# Patient Record
Sex: Female | Born: 1980 | Race: White | Hispanic: No | Marital: Married | Smoking: Never smoker
Health system: Southern US, Community
[De-identification: ages and names within clinical notes are randomized; demographics above are authoritative.]

## PROBLEM LIST (undated history)

## (undated) DIAGNOSIS — Z789 Other specified health status: Secondary | ICD-10-CM

## (undated) HISTORY — DX: Other specified health status: Z78.9

## (undated) HISTORY — PX: NO PAST SURGERIES: SHX2092

---

## 2018-03-03 NOTE — L&D Delivery Note (Addendum)
Patient: Alyssa Hodge MRN: [id number]  GBS status: Negative  Patient is a 37 y.o. now G1P1 s/p NSVD at [redacted]w[redacted]d, who was admitted for SOL. AROM 11h 53m prior to delivery with clear fluid.    Delivery Note At 1:40 PM a viable female was delivered via Vaginal, Spontaneous (Presentation:Vertx; LOA.  APGAR: 7, 9; weight pending.   Placenta status: Spontaneous , intact.  Cord: Three-vessel cord with the following complications: Bilateral labial tears  Anesthesia: Epidural Episiotomy: None Lacerations: Labial Suture Repair: 3.0 vicryl Est. Blood Loss (mL):  100 mL  Mom to postpartum.  Baby to Couplet care / Skin to Skin.  Gifford Shave 12/22/2018, 2:12 PM   Head delivered LOA. No nuchal cord present. Shoulder and body delivered in usual fashion. Infant with spontaneous cry, placed on mother's abdomen, dried and bulb suctioned. Cord clamped x 2 after 1-minute delay, and cut by delivering mother. Cord blood drawn. Placenta delivered spontaneously with gentle cord traction. Fundus firm with massage and Pitocin. Perineum inspected and found to have bilateral labial lacerations which were repaired with 3.0 Vicryl sutures with good hemostasis achieved.  Midwife attestation: I was gloved and present for delivery in its entirety and I agree with the above resident's note.  Lajean Manes, CNM 2:33 PM

## 2018-04-28 ENCOUNTER — Emergency Department (HOSPITAL_BASED_OUTPATIENT_CLINIC_OR_DEPARTMENT_OTHER)
Admission: EM | Admit: 2018-04-28 | Discharge: 2018-04-29 | Disposition: A | Discharge status: Home / Self Care | Payer: PRIVATE HEALTH INSURANCE | Attending: Emergency Medicine | Admitting: Emergency Medicine

## 2018-04-28 ENCOUNTER — Other Ambulatory Visit: Payer: Self-pay

## 2018-04-28 ENCOUNTER — Encounter (HOSPITAL_BASED_OUTPATIENT_CLINIC_OR_DEPARTMENT_OTHER): Payer: Self-pay | Admitting: Emergency Medicine

## 2018-04-28 ENCOUNTER — Emergency Department (HOSPITAL_BASED_OUTPATIENT_CLINIC_OR_DEPARTMENT_OTHER): Payer: PRIVATE HEALTH INSURANCE

## 2018-04-28 DIAGNOSIS — R1013 Epigastric pain: Secondary | ICD-10-CM | POA: Diagnosis not present

## 2018-04-28 DIAGNOSIS — O210 Mild hyperemesis gravidarum: Secondary | ICD-10-CM | POA: Diagnosis not present

## 2018-04-28 DIAGNOSIS — O26891 Other specified pregnancy related conditions, first trimester: Secondary | ICD-10-CM | POA: Insufficient documentation

## 2018-04-28 DIAGNOSIS — Z3A01 Less than 8 weeks gestation of pregnancy: Secondary | ICD-10-CM | POA: Insufficient documentation

## 2018-04-28 DIAGNOSIS — E860 Dehydration: Secondary | ICD-10-CM | POA: Insufficient documentation

## 2018-04-28 DIAGNOSIS — O219 Vomiting of pregnancy, unspecified: Secondary | ICD-10-CM | POA: Diagnosis present

## 2018-04-28 LAB — PREGNANCY, URINE: Preg Test, Ur: POSITIVE — AB

## 2018-04-28 NOTE — ED Triage Notes (Signed)
Pt c/o persistent vomiting since this am getting worse with ABD pain, pt is [redacted] weeks pregnant.

## 2018-04-29 LAB — URINALYSIS, ROUTINE W REFLEX MICROSCOPIC
Bilirubin Urine: NEGATIVE
Glucose, UA: NEGATIVE mg/dL
HGB URINE DIPSTICK: NEGATIVE
Ketones, ur: >80 mg/dL — AB
Nitrite: NEGATIVE
Protein, ur: NEGATIVE mg/dL
Specific Gravity, Urine: 1.025 (ref 1.005–1.030)
pH: 6 (ref 5.0–8.0)

## 2018-04-29 LAB — URINALYSIS, MICROSCOPIC (REFLEX)

## 2018-04-29 MED ORDER — SODIUM CHLORIDE 0.9 % IV BOLUS (SEPSIS)
1000.0000 mL | Freq: Once | INTRAVENOUS | Status: AC
  Administered 2018-04-29: 1000 mL via INTRAVENOUS

## 2018-04-29 MED ORDER — ACETAMINOPHEN 325 MG PO TABS
650.0000 mg | ORAL_TABLET | Freq: Once | ORAL | Status: AC
  Administered 2018-04-29: 650 mg via ORAL
  Filled 2018-04-29: qty 2

## 2018-04-29 MED ORDER — DOXYLAMINE-PYRIDOXINE 10-10 MG PO TBEC: 1.0000 | DELAYED_RELEASE_TABLET | Freq: Three times a day (TID) | ORAL | 0 refills | Status: DC | PRN

## 2018-04-29 MED ORDER — ONDANSETRON HCL 4 MG/2ML IJ SOLN
4.0000 mg | Freq: Once | INTRAMUSCULAR | Status: AC
  Administered 2018-04-29: 4 mg via INTRAVENOUS
  Filled 2018-04-29: qty 2

## 2018-04-29 NOTE — ED Provider Notes (Signed)
[facility] EMERGENCY DEPARTMENT Provider Note   CSN: [id number] Arrival date & time: 04/28/18  2330    History   Chief Complaint Chief Complaint  Patient presents with  . Emesis  . Headache    HPI Alyssa Hodge is a 38 y.o. female.     The history is provided by the patient and the spouse.  Emesis  Severity:  Moderate Duration:  1 day Timing:  Constant Progression:  Worsening Chronicity:  New Relieved by:  Nothing Exacerbated by: palpation. Associated symptoms: abdominal pain and headaches   Associated symptoms: no diarrhea and no fever   Risk factors: pregnant   Headache  Associated symptoms: abdominal pain, nausea and vomiting   Associated symptoms: no diarrhea and no fever    Pt reports onset of epigastric abd pain a day ago with associated nausea/vomiting.  She reports she has had the nausea for at least a week Pt reports she is [redacted] weeks pregnant Of note, pt speaks english   PMH-none OB History    Gravida  1   Para      Term      Preterm      AB      Living        SAB      TAB      Ectopic      Multiple      Live Births               Home Medications    Prior to Admission medications   Medication Sig Start Date End Date Taking? Authorizing Provider  amoxicillin (AMOXIL) 875 MG tablet Take 875 mg by mouth 2 (two) times daily. 04/19/18   [provider]    Family History History reviewed. No pertinent family history.  Social History Social History   Tobacco Use  . Smoking status: Never Smoker  . Smokeless tobacco: Never Used  Substance Use Topics  . Alcohol use: Never    Frequency: Never  . Drug use: Never     Allergies   Patient has no known allergies.   Review of Systems Review of Systems  Constitutional: Negative for fever.  Gastrointestinal: Positive for abdominal pain, nausea and vomiting. Negative for diarrhea.  Genitourinary: Negative for vaginal bleeding and vaginal discharge.    Neurological: Positive for headaches.  All other systems reviewed and are negative.    Physical Exam Updated Vital Signs BP 132/68 (BP Location: Right Arm)   Pulse 88   Temp 98 F (36.7 C) (Oral)   Resp 18   Ht 1.753 m (5\' 9" )   Wt 63 kg   LMP 02/26/2018   SpO2 100%   BMI 20.51 kg/m   Physical Exam  CONSTITUTIONAL: Well developed/well nourished HEAD: Normocephalic/atraumatic EYES: EOMI/PERRL, no icterus ENMT: Mucous membranes moist NECK: supple no meningeal signs SPINE/BACK:entire spine nontender CV: S1/S2 noted, no murmurs/rubs/gallops noted LUNGS: Lungs are clear to auscultation bilaterally, no apparent distress ABDOMEN: soft, mild epigastric tenderness, no rebound or guarding, bowel sounds noted throughout abdomen GU:no cva tenderness NEURO: Pt is awake/alert/appropriate, moves all extremitiesx4.  No facial droop.   EXTREMITIES: pulses normal/equal, full ROM SKIN: warm, color normal PSYCH: no abnormalities of mood noted, alert and oriented to situation  ED Treatments / Results  Labs (all labs ordered are listed, but only abnormal results are displayed) Labs Reviewed  URINALYSIS, ROUTINE W REFLEX MICROSCOPIC - Abnormal; Notable for the following components:      Result Value   APPearance HAZY (*)  Ketones, ur >80 (*)    Leukocytes,Ua TRACE (*)    All other components within normal limits  PREGNANCY, URINE - Abnormal; Notable for the following components:   Preg Test, Ur POSITIVE (*)    All other components within normal limits  URINALYSIS, MICROSCOPIC (REFLEX) - Abnormal; Notable for the following components:   Bacteria, UA RARE (*)    All other components within normal limits    EKG None  Radiology US Ob Transvaginal  Result Date: 04/29/2018 CLINICAL DATA:  Abdominal and pelvic pain for 1 day. Estimated gestational age by LMP is 8 weeks 6 days. Quantitative beta HCG is not available. EXAM: OBSTETRIC <14 WK [place] AND TRANSVAGINAL OB US TECHNIQUE: Both  transabdominal and transvaginal ultrasound examinations were performed for complete evaluation of the gestation as well as the maternal uterus, adnexal regions, and pelvic cul-de-sac. Transvaginal technique was performed to assess early pregnancy. COMPARISON:  None. FINDINGS: Intrauterine gestational sac: A single intrauterine gestational sac is present. Yolk sac:  Yolk sac is present. Embryo:  Fetal pole is identified. Cardiac Activity: Fetal cardiac activity is observed. Heart Rate: 115 bpm CRL:  7.2 mm   6 w   4 d                  [place] EDC: 12/18/2018 Subchorionic hemorrhage:  None visualized. Maternal uterus/adnexae: Uterus is anteverted. No myometrial mass lesions identified. Both ovaries are visualized and appear normal. No abnormal adnexal masses. Corpus luteal cyst on the left. Small amount of free fluid in the pelvis adjacent to the left ovary. IMPRESSION: Single intrauterine pregnancy. Estimated gestational age by crown-rump length is 6 weeks 4 days. No acute complication is suggested sonographically. Electronically Signed   By: Burman Nieves M.D.   On: 04/29/2018 01:02    Procedures Procedures    Medications Ordered in ED Medications  sodium chloride 0.9 % bolus 1,000 mL (0 mLs Intravenous Stopped 04/29/18 0237)  ondansetron (ZOFRAN) injection 4 mg (4 mg Intravenous Given 04/29/18 0129)  acetaminophen (TYLENOL) tablet 650 mg (650 mg Oral Given 04/29/18 0235)     Initial Impression / Assessment and Plan / ED Course  I have reviewed the triage vital signs and the nursing notes.  Pertinent labs  results that were available during my care of the patient were reviewed by me and considered in my medical decision making (see chart for details).        Pt presents with epigastric pain and vomiting Patient found to be pregnant with IUP at approximately 6 weeks.  She denies any vaginal/pelvic complaints this time. Strong suspicion her nausea vomiting is caused by hyperemesis gravidarum Her  abdominal exam is otherwise unremarkable   Patient improved no acute distress.  She is taking p.o. fluids.  She reports feeling improved.  Suspect headache is likely due to dehydration. No signs of any acute abdominal/oncologic emergency He is already taking prenatal vitamins.  We will start her on oral antiemetics.  She already has follow-up with OB/GYN Final Clinical Impressions(s) / ED Diagnoses   Final diagnoses:  Hyperemesis gravidarum  Dehydration  Less than [redacted] weeks gestation of pregnancy    ED Discharge Orders         Ordered    Doxylamine-Pyridoxine 10-10 MG TBEC  3 times daily PRN     04/29/18 0306           Zadie Rhine, MD 04/29/18 0330

## 2018-04-29 NOTE — ED Notes (Signed)
PO challenge given  

## 2018-04-29 NOTE — ED Notes (Signed)
Patient transported to Ultrasound 

## 2018-05-03 ENCOUNTER — Other Ambulatory Visit: Payer: Self-pay | Admitting: Obstetrics & Gynecology

## 2018-05-03 DIAGNOSIS — O3680X0 Pregnancy with inconclusive fetal viability, not applicable or unspecified: Secondary | ICD-10-CM

## 2018-05-04 ENCOUNTER — Telehealth: Payer: Self-pay | Admitting: *Deleted

## 2018-05-04 ENCOUNTER — Other Ambulatory Visit: Payer: Self-pay | Admitting: Obstetrics & Gynecology

## 2018-05-04 ENCOUNTER — Other Ambulatory Visit: Payer: Self-pay

## 2018-05-04 NOTE — Telephone Encounter (Signed)
There is a generic has she tried to get it filled as a generic, it is written as a generic

## 2018-05-04 NOTE — Telephone Encounter (Signed)
Patient notified she does not need dating u/s today as she was just seen at the hospital on 2/26 and u/s was performed then.  She is having issues with nausea and was prescribed Diclegis but is not covered by her insurancea and is $200 OOP.  New OB is not until 3/31. She is requesting something else that would be covered by her insurance. Please advise.

## 2018-05-05 ENCOUNTER — Telehealth: Payer: Self-pay | Admitting: *Deleted

## 2018-05-05 NOTE — Telephone Encounter (Signed)
Patient's husband called regarding patient's medication they still do not have it. The husband states he is working in [place] today and will need this to be filled in [place] at [facility] [address]. Please advise

## 2018-05-05 NOTE — Telephone Encounter (Signed)
Patient was seen in ED on 2/26 and was prescribed Diclegis but generic and brand are over $100.  Is there something else that could be sent in for this patient? New OB not until 3/31. Please advise.

## 2018-05-06 ENCOUNTER — Telehealth: Payer: Self-pay | Admitting: Women's Health

## 2018-05-06 ENCOUNTER — Telehealth: Payer: Self-pay | Admitting: *Deleted

## 2018-05-06 NOTE — Telephone Encounter (Signed)
Spoke to husband and he will bring patient at 11am on Monday.

## 2018-05-06 NOTE — Telephone Encounter (Signed)
Patient's spouse called, he is wanting to make an appointment for his spouse.  She has an initial ob intake and initial appointment on 06/01/18.  What for and when does she need to come in?  [phone]

## 2018-05-06 NOTE — Telephone Encounter (Signed)
Spoke to husband who states she has tried both.  Will need to be seen in order to prescribe something else.  States he is working today and tomorrow but will call back to let us know if she can come tomorrow.

## 2018-05-10 ENCOUNTER — Encounter: Payer: PRIVATE HEALTH INSURANCE | Admitting: Advanced Practice Midwife

## 2018-05-11 ENCOUNTER — Other Ambulatory Visit: Payer: Self-pay

## 2018-05-11 ENCOUNTER — Ambulatory Visit: Payer: PRIVATE HEALTH INSURANCE | Admitting: Family Medicine

## 2018-05-11 ENCOUNTER — Encounter: Payer: Self-pay | Admitting: Family Medicine

## 2018-05-11 VITALS — BP 117/74 | HR 86 | Ht 67.0 in | Wt 142.0 lb

## 2018-05-11 DIAGNOSIS — Z3A08 8 weeks gestation of pregnancy: Secondary | ICD-10-CM

## 2018-05-11 DIAGNOSIS — O219 Vomiting of pregnancy, unspecified: Secondary | ICD-10-CM

## 2018-05-11 DIAGNOSIS — O09519 Supervision of elderly primigravida, unspecified trimester: Secondary | ICD-10-CM

## 2018-05-11 DIAGNOSIS — Z331 Pregnant state, incidental: Secondary | ICD-10-CM

## 2018-05-11 DIAGNOSIS — O09511 Supervision of elderly primigravida, first trimester: Secondary | ICD-10-CM

## 2018-05-11 MED ORDER — PROMETHAZINE HCL 25 MG PO TABS: 25.0000 mg | ORAL_TABLET | Freq: Four times a day (QID) | ORAL | 2 refills | Status: DC | PRN

## 2018-05-11 MED ORDER — METOCLOPRAMIDE HCL 10 MG PO TABS: 10.0000 mg | ORAL_TABLET | Freq: Four times a day (QID) | ORAL | 2 refills | Status: DC | PRN

## 2018-05-11 NOTE — Progress Notes (Signed)
    GYNECOLOGY PROBLEM  VISIT ENCOUNTER NOTE  Subjective:   Alyssa Hodge is a 38 y.o. G1P0 female   Chief Complaint  Patient presents with  . Morning Sickness    EDC 12/18/2018 by [redacted]w[redacted]d US---> 103w3d today by [place] LMP 02/27/2019--> [redacted]w[redacted]d by LMP  Use [place] dating for pregnancy  Bedside US showed FHR 160, IUP   Denies abnormal vaginal bleeding, discharge, pelvic pain, problems with intercourse or other gynecologic concerns.    Gynecologic History Patient's last menstrual period was 02/26/2018. Contraception: none  Health Maintenance Due  Topic Date Due  . HIV Screening  07/15/1997  . TETANUS/TDAP  07/15/2001  . PAP SMEAR-Modifier  07/16/2003  . INFLUENZA VACCINE  10/01/2017     The following portions of the patient's history were reviewed and updated as appropriate: allergies, current medications, past family history, past medical history, past social history, past surgical history and problem list.  Review of Systems Pertinent items are noted in HPI.   Objective:  BP 117/74 (BP Location: Right Arm, Patient Position: Sitting, Cuff Size: Normal)   Pulse 86   Ht 5\' 7"  (1.702 m)   Wt 142 lb (64.4 kg)   LMP 02/26/2018   BMI 22.24 kg/m  Gen: well appearing, NAD HEENT: no scleral icterus CV: RR Lung: Normal WOB Ext: warm well perfused   Assessment and Plan:  1. Nausea and vomiting during pregnancy Unable to afford diclegis - promethazine (PHENERGAN) 25 MG tablet; Take 1 tablet (25 mg total) by mouth every 6 (six) hours as needed for nausea or vomiting.  Dispense: 30 tablet; Refill: 2 - metoCLOPramide (REGLAN) 10 MG tablet; Take 1 tablet (10 mg total) by mouth 4 (four) times daily as needed for nausea or vomiting.  Dispense: 30 tablet; Refill: 2  2. Advanced maternal age, primigravida, antepartum Desires FIRST trimester screening- ordered today  3. Pregnant state, incidental   Please refer to After Visit Summary for other counseling recommendations.   No follow-ups on  file.  Federico Flake, MD, MPH, ABFM Attending Physician Faculty Practice- [facility] for [facility]

## 2018-05-11 NOTE — Patient Instructions (Addendum)
Start taking doxylamine (unisom) and B6   Start using phenergan  Recommend you buy Seabands  Use product containing ginger         Common Medications Safe in Pregnancy  Acne:      Constipation:  Benzoyl Peroxide     Colace  Clindamycin      Dulcolax Suppository  Topica Erythromycin     Fibercon  Salicylic Acid      Metamucil         Miralax AVOID:        Senakot   Accutane    Cough:  Retin-A       Cough Drops  Tetracycline      Phenergan w/ Codeine if Rx  Minocycline      Robitussin (Plain & DM)  Antibiotics:     Crabs/Lice:   Ceclor       RID  Cephalosporins    AVOID:  E-Mycins      Kwell  Keflex  Macrobid/Macrodantin   Diarrhea:  Penicillin      Kao-Pectate  Zithromax      Imodium AD         PUSH FLUIDS AVOID:       Cipro     Fever:  Tetracycline      Tylenol (Regular or Extra  Minocycline       Strength)  Levaquin      Extra Strength-Do not          Exceed 8 tabs/24 hrs Caffeine:        <273m/day (equiv. To 1 cup of coffee or  approx. 3 12 oz sodas)         Gas: Cold/Hayfever:       Gas-X  Benadryl      Mylicon  Claritin       Phazyme  **Claritin-D        Chlor-Trimeton    Headaches:  Dimetapp      ASA-Free Excedrin  Drixoral-Non-Drowsy     Cold Compress  Mucinex (Guaifenasin)     Tylenol (Regular or Extra  Sudafed/Sudafed-12 Hour     Strength)  **Sudafed PE Pseudoephedrine   Tylenol Cold & Sinus     Vicks Vapor Rub  Zyrtec  **AVOID if Problems With Blood Pressure         Heartburn: Avoid lying down for at least 1 hour after meals  Aciphex      Maalox     Rash:  Milk of Magnesia     Benadryl    Mylanta       1% Hydrocortisone Cream  Pepcid  Pepcid Complete   Sleep Aids:  Prevacid      Ambien   Prilosec       Benadryl  Rolaids       Chamomile Tea  Tums (Limit 4/day)     Unisom  Zantac       Tylenol PM         Warm milk-add vanilla or  Hemorrhoids:       Sugar for taste  Anusol/Anusol H.C.  (RX: Analapram 2.5%)  Sugar  Substitutes:  Hydrocortisone OTC     Ok in moderation  Preparation H      Tucks        Vaseline lotion applied to tissue with wiping    Herpes:     Throat:  Acyclovir      Oragel  Famvir  Valtrex     Vaccines:         Flu  Shot Leg Cramps:       *Gardasil  Benadryl      Hepatitis A         Hepatitis B Nasal Spray:       Pneumovax  Saline Nasal Spray     Polio Booster         Tetanus Nausea:       Tuberculosis test or PPD  Vitamin B6 25 mg TID   AVOID:    Dramamine      *Gardasil  Emetrol       Live Poliovirus  Ginger Root 250 mg QID    MMR (measles, mumps &  High Complex Carbs @ Bedtime    rebella)  Sea Bands-Accupressure    Varicella (Chickenpox)  Unisom 1/2 tab TID     *No known complications           If received before Pain:         Known pregnancy;   Darvocet       Resume series after  Lortab        Delivery  Percocet    Yeast:   Tramadol      Femstat  Tylenol 3      Gyne-lotrimin  Ultram       Monistat  Vicodin           MISC:         All Sunscreens           Hair Coloring/highlights          Insect Repellant's          (Including DEET)         Mystic Tans

## 2018-05-17 ENCOUNTER — Encounter: Payer: Self-pay | Admitting: *Deleted

## 2018-05-18 ENCOUNTER — Other Ambulatory Visit: Payer: PRIVATE HEALTH INSURANCE

## 2018-05-18 ENCOUNTER — Ambulatory Visit: Payer: PRIVATE HEALTH INSURANCE

## 2018-05-18 ENCOUNTER — Other Ambulatory Visit: Payer: Self-pay

## 2018-06-01 ENCOUNTER — Ambulatory Visit: Payer: PRIVATE HEALTH INSURANCE | Admitting: *Deleted

## 2018-06-01 ENCOUNTER — Encounter: Payer: PRIVATE HEALTH INSURANCE | Admitting: Women's Health

## 2018-06-14 ENCOUNTER — Other Ambulatory Visit: Payer: Self-pay | Admitting: Family Medicine

## 2018-06-14 DIAGNOSIS — O09519 Supervision of elderly primigravida, unspecified trimester: Secondary | ICD-10-CM

## 2018-06-14 DIAGNOSIS — Z3682 Encounter for antenatal screening for nuchal translucency: Secondary | ICD-10-CM

## 2018-06-15 ENCOUNTER — Ambulatory Visit (INDEPENDENT_AMBULATORY_CARE_PROVIDER_SITE_OTHER): Payer: PRIVATE HEALTH INSURANCE | Admitting: Women's Health

## 2018-06-15 ENCOUNTER — Ambulatory Visit (INDEPENDENT_AMBULATORY_CARE_PROVIDER_SITE_OTHER): Payer: PRIVATE HEALTH INSURANCE

## 2018-06-15 ENCOUNTER — Ambulatory Visit: Payer: PRIVATE HEALTH INSURANCE | Admitting: *Deleted

## 2018-06-15 ENCOUNTER — Encounter: Payer: Self-pay | Admitting: Women's Health

## 2018-06-15 ENCOUNTER — Other Ambulatory Visit: Payer: Self-pay

## 2018-06-15 ENCOUNTER — Encounter: Payer: PRIVATE HEALTH INSURANCE | Admitting: Obstetrics & Gynecology

## 2018-06-15 VITALS — BP 119/79 | HR 88 | Wt 136.0 lb

## 2018-06-15 DIAGNOSIS — Z3401 Encounter for supervision of normal first pregnancy, first trimester: Secondary | ICD-10-CM

## 2018-06-15 DIAGNOSIS — Z3A13 13 weeks gestation of pregnancy: Secondary | ICD-10-CM

## 2018-06-15 DIAGNOSIS — O09519 Supervision of elderly primigravida, unspecified trimester: Secondary | ICD-10-CM

## 2018-06-15 DIAGNOSIS — Z3400 Encounter for supervision of normal first pregnancy, unspecified trimester: Secondary | ICD-10-CM | POA: Insufficient documentation

## 2018-06-15 DIAGNOSIS — Z331 Pregnant state, incidental: Secondary | ICD-10-CM

## 2018-06-15 DIAGNOSIS — Z3682 Encounter for antenatal screening for nuchal translucency: Secondary | ICD-10-CM | POA: Diagnosis not present

## 2018-06-15 DIAGNOSIS — Z1389 Encounter for screening for other disorder: Secondary | ICD-10-CM

## 2018-06-15 DIAGNOSIS — Z1379 Encounter for other screening for genetic and chromosomal anomalies: Secondary | ICD-10-CM

## 2018-06-15 DIAGNOSIS — Z3402 Encounter for supervision of normal first pregnancy, second trimester: Secondary | ICD-10-CM

## 2018-06-15 DIAGNOSIS — Z363 Encounter for antenatal screening for malformations: Secondary | ICD-10-CM

## 2018-06-15 DIAGNOSIS — O09512 Supervision of elderly primigravida, second trimester: Secondary | ICD-10-CM | POA: Diagnosis not present

## 2018-06-15 LAB — POCT URINALYSIS DIPSTICK OB
Blood, UA: NEGATIVE
Glucose, UA: NEGATIVE
Ketones, UA: NEGATIVE
Leukocytes, UA: NEGATIVE
Nitrite, UA: NEGATIVE

## 2018-06-15 NOTE — Progress Notes (Addendum)
[place] 13+3 wks,measurement c/w dates,NB present,NT 2.1 mm,crl 71.7 mm,fhr 153 bpm,left lateral placenta,normal right ovary,simple left ovarian cyst 2.7 x 2.3 x 2.6 cm

## 2018-06-15 NOTE — Progress Notes (Signed)
   TELEHEALTH VIRTUAL NEW OBSTETRICS VISIT ENCOUNTER NOTE  I connected with Alyssa Hodge on 06/15/18 at 11:30 AM EDT by telephone at home and verified that I am speaking with the correct person using two identifiers.   I discussed the limitations, risks, security and privacy concerns of performing an evaluation and management service by telephone and the availability of in person appointments. I also discussed with the patient that there may be a patient responsible charge related to this service. The patient expressed understanding and agreed to proceed.  Subjective:  Alyssa Hodge is a 38 y.o. G1P0 at [redacted]w[redacted]d by 6wk u/s, being seen for her new ob visit.  She had her nt u/s today and intake w/ RN. She is currently monitored for the following issues for this low-risk pregnancy and has Supervision of normal first pregnancy on their problem list.  Patient reports no complaints, n/v much better. Reports fetal movement. Denies any contractions, bleeding or leaking of fluid.   The following portions of the patient's history were reviewed and updated as appropriate: allergies, current medications, past family history, past medical history, past social history, past surgical history and problem list.   Objective:   General:  Alert, oriented and cooperative.   Mental Status: Normal mood and affect perceived. Normal judgment and thought content.  Rest of physical exam deferred due to type of encounter BP 119/79   Pulse 88   Wt 136 lb (61.7 kg)   LMP 02/26/2018   BMI 21.30 kg/m    Today's NT [place] 13+3 wks,measurement c/w dates,NB present,NT 2.1 mm,crl 71.7 mm,fhr 153 bpm,left lateral placenta,normal right ovary,simple left ovarian cyst 2.7 x 2.3 x 2.6 cm Assessment and Plan:  Pregnancy: G1P0 at [redacted]w[redacted]d 1. Encounter for supervision of normal first pregnancy in first trimester  - Obstetric Panel, Including HIV - GC/Chlamydia Probe Amp - CHL AMB BABYSCRIPTS OPT IN - Sickle cell screen - Urinalysis,  Routine w reflex microscopic - Urine Culture - Integrated 1 - Pain Management Screening Profile (10S)  Preterm labor symptoms and general obstetric precautions including but not limited to vaginal bleeding, contractions, leaking of fluid and fetal movement were reviewed in detail with the patient.  I discussed the assessment and treatment plan with the patient. The patient was provided an opportunity to ask questions and all were answered. The patient agreed with the plan and demonstrated an understanding of the instructions. The patient was advised to call back or seek an in-person office evaluation/go to [facility] at [facility] for any urgent or concerning symptoms. Please refer to After Visit Summary for other counseling recommendations.   I provided 15 minutes of non-face-to-face time during this encounter.  Return for 5/18 for anatomy u/s, 2nd IT and LROB w/ pap.  No future appointments.  Cheral Marker, CNM Center for [employer], [facility]

## 2018-06-17 LAB — OBSTETRIC PANEL, INCLUDING HIV
Antibody Screen: NEGATIVE
Basophils Absolute: 0 10*3/uL (ref 0.0–0.2)
Basos: 0 %
EOS (ABSOLUTE): 0 10*3/uL (ref 0.0–0.4)
Eos: 0 %
HIV Screen 4th Generation wRfx: NONREACTIVE
Hematocrit: 40.4 % (ref 34.0–46.6)
Hemoglobin: 13.3 g/dL (ref 11.1–15.9)
Hepatitis B Surface Ag: NEGATIVE
Immature Grans (Abs): 0 10*3/uL (ref 0.0–0.1)
Immature Granulocytes: 0 %
Lymphocytes Absolute: 1.6 10*3/uL (ref 0.7–3.1)
Lymphs: 18 %
MCH: 28.8 pg (ref 26.6–33.0)
MCHC: 32.9 g/dL (ref 31.5–35.7)
MCV: 87 fL (ref 79–97)
Monocytes Absolute: 0.8 10*3/uL (ref 0.1–0.9)
Monocytes: 9 %
Neutrophils Absolute: 6.8 10*3/uL (ref 1.4–7.0)
Neutrophils: 73 %
Platelets: 292 10*3/uL (ref 150–450)
RBC: 4.62 x10E6/uL (ref 3.77–5.28)
RDW: 13.2 % (ref 11.7–15.4)
RPR Ser Ql: NONREACTIVE
Rh Factor: POSITIVE
Rubella Antibodies, IGG: 20 index (ref >0.99)
WBC: 9.3 10*3/uL (ref 3.4–10.8)

## 2018-06-17 LAB — INTEGRATED 1
Crown Rump Length: 71.7 mm
Gest. Age on Collection Date: 13.1 weeks
Maternal Age at EDD: 36.4 yr
Nuchal Translucency (NT): 2.1 mm
Number of Fetuses: 1
PAPP-A Value: 565.6 ng/mL
Weight: 136 [lb_av]

## 2018-06-17 LAB — SICKLE CELL SCREEN: Sickle Cell Screen: NEGATIVE

## 2018-06-17 LAB — MICROSCOPIC EXAMINATION
Bacteria, UA: NONE SEEN
Casts: NONE SEEN /lpf
Epithelial Cells (non renal): >10 /hpf — AB (ref 0–10)
RBC, Urine: NONE SEEN /hpf (ref 0–2)

## 2018-06-17 LAB — URINALYSIS, ROUTINE W REFLEX MICROSCOPIC
Bilirubin, UA: NEGATIVE
Glucose, UA: NEGATIVE
Ketones, UA: NEGATIVE
Nitrite, UA: NEGATIVE
RBC, UA: NEGATIVE
Specific Gravity, UA: 1.025 (ref 1.005–1.030)
Urobilinogen, Ur: 0.2 mg/dL (ref 0.2–1.0)
pH, UA: 5 (ref 5.0–7.5)

## 2018-07-01 ENCOUNTER — Other Ambulatory Visit: Payer: Self-pay | Admitting: Women's Health

## 2018-07-01 MED ORDER — ONDANSETRON HCL 4 MG PO TABS: 4.0000 mg | ORAL_TABLET | Freq: Three times a day (TID) | ORAL | 0 refills | Status: DC | PRN

## 2018-07-19 ENCOUNTER — Encounter: Payer: Self-pay | Admitting: *Deleted

## 2018-07-20 ENCOUNTER — Other Ambulatory Visit: Payer: Self-pay

## 2018-07-20 ENCOUNTER — Encounter: Payer: Self-pay | Admitting: Obstetrics & Gynecology

## 2018-07-20 ENCOUNTER — Ambulatory Visit (INDEPENDENT_AMBULATORY_CARE_PROVIDER_SITE_OTHER): Payer: PRIVATE HEALTH INSURANCE | Admitting: Obstetrics & Gynecology

## 2018-07-20 ENCOUNTER — Ambulatory Visit (INDEPENDENT_AMBULATORY_CARE_PROVIDER_SITE_OTHER): Payer: PRIVATE HEALTH INSURANCE

## 2018-07-20 ENCOUNTER — Other Ambulatory Visit (HOSPITAL_COMMUNITY)
Admission: RE | Admit: 2018-07-20 | Discharge: 2018-07-20 | Disposition: A | Discharge status: Home / Self Care | Payer: PRIVATE HEALTH INSURANCE | Source: Ambulatory Visit | Attending: Obstetrics & Gynecology | Admitting: Obstetrics & Gynecology

## 2018-07-20 VITALS — BP 110/72 | HR 92 | Wt 140.0 lb

## 2018-07-20 DIAGNOSIS — Z3402 Encounter for supervision of normal first pregnancy, second trimester: Secondary | ICD-10-CM

## 2018-07-20 DIAGNOSIS — Z1389 Encounter for screening for other disorder: Secondary | ICD-10-CM

## 2018-07-20 DIAGNOSIS — Z3A18 18 weeks gestation of pregnancy: Secondary | ICD-10-CM | POA: Diagnosis not present

## 2018-07-20 DIAGNOSIS — Z1379 Encounter for other screening for genetic and chromosomal anomalies: Secondary | ICD-10-CM

## 2018-07-20 DIAGNOSIS — Z331 Pregnant state, incidental: Secondary | ICD-10-CM

## 2018-07-20 DIAGNOSIS — Z363 Encounter for antenatal screening for malformations: Secondary | ICD-10-CM

## 2018-07-20 DIAGNOSIS — Z3401 Encounter for supervision of normal first pregnancy, first trimester: Secondary | ICD-10-CM

## 2018-07-20 LAB — POCT URINALYSIS DIPSTICK OB
Blood, UA: NEGATIVE
Glucose, UA: NEGATIVE
Leukocytes, UA: NEGATIVE
Nitrite, UA: NEGATIVE
POC,PROTEIN,UA: NEGATIVE

## 2018-07-20 MED ORDER — ONDANSETRON HCL 4 MG PO TABS: 4.0000 mg | ORAL_TABLET | Freq: Three times a day (TID) | ORAL | 0 refills | Status: DC | PRN

## 2018-07-20 NOTE — Progress Notes (Signed)
[place] 18+3 wks,cephalic,left lateral placenta gr 0,normal ovaries bilat,cx 3.2 cm,svp of fluid 4.9 cm,fhr 140 bpm,efw 260 g 68%,anatomy complete,no obvious abnormalities

## 2018-07-20 NOTE — Progress Notes (Signed)
   LOW-RISK PREGNANCY VISIT Patient name: Alyssa Hodge MRN [id number]  Date of birth: 20-Sep-1980 Chief Complaint:   Routine Prenatal Visit ([place], 2nd IT)  History of Present Illness:   Alyssa Hodge is a 38 y.o. G1P0 female at [redacted]w[redacted]d with an Estimated Date of Delivery: 12/18/18 being seen today for ongoing management of a low-risk pregnancy.  Today she reports no complaints.  . Vag. Bleeding: None.   . denies leaking of fluid. Review of Systems:   Pertinent items are noted in HPI Denies abnormal vaginal discharge w/ itching/odor/irritation, headaches, visual changes, shortness of breath, chest pain, abdominal pain, severe nausea/vomiting, or problems with urination or bowel movements unless otherwise stated above. Pertinent History Reviewed:  Reviewed past medical,surgical, social, obstetrical and family history.  Reviewed problem list, medications and allergies. Physical Assessment:   Vitals:   07/20/18 1412  BP: 110/72  Pulse: 92  Weight: 140 lb (63.5 kg)  Body mass index is 21.93 kg/m.        Physical Examination:   General appearance: Well appearing, and in no distress  Mental status: Alert, oriented to person, place, and time  Skin: Warm & dry  Cardiovascular: Normal heart rate noted  Respiratory: Normal respiratory effort, no distress  Abdomen: Soft, gravid, nontender  Pelvic: Cervical exam deferred         Extremities: Edema: None  Fetal Status: Fetal Heart Rate (bpm): 140 Fundal Height: 18 cm      Results for orders placed or performed in visit on 07/20/18 (from the past 24 hour(s))  POC Urinalysis Dipstick OB   Collection Time: 07/20/18  2:13 PM  Result Value Ref Range   Color, UA     Clarity, UA     Glucose, UA Negative Negative   Bilirubin, UA     Ketones, UA trace    Spec Grav, UA     Blood, UA neg    pH, UA     POC,PROTEIN,UA Negative Negative, Trace, Small (1+), Moderate (2+), Large (3+), 4+   Urobilinogen, UA     Nitrite, UA neg    Leukocytes, UA Negative  Negative   Appearance     Odor      Assessment & Plan:  1) Low-risk pregnancy G1P0 at [redacted]w[redacted]d with an Estimated Date of Delivery: 12/18/18   2) sonogram is normal,    Meds:  Meds ordered this encounter  Medications  . ondansetron (ZOFRAN) 4 MG tablet    Sig: Take 1 tablet (4 mg total) by mouth every 8 (eight) hours as needed for nausea or vomiting.    Dispense:  20 tablet    Refill:  0   Labs/procedures today: 2nd IT + sonogram which is normal  Plan:  Continue routine obstetrical care   Reviewed:  labor symptoms and general obstetric precautions including but not limited to vaginal bleeding, contractions, leaking of fluid and fetal movement were reviewed in detail with the patient.  All questions were answered  Follow-up: Return in about 4 weeks (around 08/17/2018) for webex visit, PN2 in office in 8 weeks.  Orders Placed This Encounter  Procedures  . INTEGRATED 2  . POC Urinalysis Dipstick OB   Lazaro Arms 07/20/2018 2:44 PM

## 2018-07-21 LAB — PMP SCREEN PROFILE (10S), URINE
Amphetamine Scrn, Ur: NEGATIVE ng/mL
BARBITURATE SCREEN URINE: NEGATIVE ng/mL
BENZODIAZEPINE SCREEN, URINE: NEGATIVE ng/mL
CANNABINOIDS UR QL SCN: NEGATIVE ng/mL
Cocaine (Metab) Scrn, Ur: NEGATIVE ng/mL
Creatinine(Crt), U: 138.1 mg/dL (ref 20.0–300.0)
Methadone Screen, Urine: NEGATIVE ng/mL
OXYCODONE+OXYMORPHONE UR QL SCN: NEGATIVE ng/mL
Opiate Scrn, Ur: NEGATIVE ng/mL
Ph of Urine: 5.3 (ref 4.5–8.9)
Phencyclidine Qn, Ur: NEGATIVE ng/mL
Propoxyphene Scrn, Ur: NEGATIVE ng/mL

## 2018-07-22 LAB — INTEGRATED 2
AFP MoM: 1.25
Alpha-Fetoprotein: 53.6 ng/mL
Crown Rump Length: 71.7 mm
DIA MoM: 1.41
DIA Value: 246 pg/mL
Estriol, Unconjugated: 2.07 ng/mL
Gest. Age on Collection Date: 13.1 weeks
Gestational Age: 18.1 weeks
Maternal Age at EDD: 36.4 yr
Nuchal Translucency (NT): 2.1 mm
Nuchal Translucency MoM: 1.28
Number of Fetuses: 1
PAPP-A MoM: 0.42
PAPP-A Value: 565.6 ng/mL
Test Results:: NEGATIVE
Weight: 136 [lb_av]
Weight: 140 [lb_av]
hCG MoM: 0.66
hCG Value: 17.4 IU/mL
uE3 MoM: 1.45

## 2018-07-22 LAB — URINE CULTURE

## 2018-07-23 LAB — CYTOLOGY - PAP
Chlamydia: NEGATIVE
HPV: DETECTED — AB
Neisseria Gonorrhea: NEGATIVE

## 2018-07-27 ENCOUNTER — Encounter: Payer: Self-pay | Admitting: *Deleted

## 2018-08-09 ENCOUNTER — Other Ambulatory Visit: Payer: Self-pay

## 2018-08-09 ENCOUNTER — Ambulatory Visit (INDEPENDENT_AMBULATORY_CARE_PROVIDER_SITE_OTHER): Payer: PRIVATE HEALTH INSURANCE | Admitting: Women's Health

## 2018-08-09 ENCOUNTER — Encounter: Payer: Self-pay | Admitting: Women's Health

## 2018-08-09 VITALS — BP 114/77 | HR 95 | Wt 142.5 lb

## 2018-08-09 DIAGNOSIS — Z3402 Encounter for supervision of normal first pregnancy, second trimester: Secondary | ICD-10-CM

## 2018-08-09 DIAGNOSIS — Z3A21 21 weeks gestation of pregnancy: Secondary | ICD-10-CM

## 2018-08-09 DIAGNOSIS — Z1389 Encounter for screening for other disorder: Secondary | ICD-10-CM

## 2018-08-09 DIAGNOSIS — Z331 Pregnant state, incidental: Secondary | ICD-10-CM

## 2018-08-09 LAB — POCT URINALYSIS DIPSTICK OB
Blood, UA: NEGATIVE
Glucose, UA: NEGATIVE
Ketones, UA: NEGATIVE
Nitrite, UA: NEGATIVE
POC,PROTEIN,UA: NEGATIVE

## 2018-08-09 MED ORDER — BLOOD PRESSURE MONITOR MISC: 0 refills | Status: DC

## 2018-08-09 NOTE — Progress Notes (Signed)
   LOW-RISK PREGNANCY VISIT Patient name: Alyssa Hodge MRN [id number]  Date of birth: 1980/11/17 Chief Complaint:   Routine Prenatal Visit  History of Present Illness:   Alyssa Hodge is a 38 y.o. G1P0 female at [redacted]w[redacted]d with an Estimated Date of Delivery: 12/18/18 being seen today for ongoing management of a low-risk pregnancy.  Today she reports no complaints, thought she was getting colpo today. Contractions: Not present. Vag. Bleeding: None.  Movement: Present. denies leaking of fluid. Review of Systems:   Pertinent items are noted in HPI Denies abnormal vaginal discharge w/ itching/odor/irritation, headaches, visual changes, shortness of breath, chest pain, abdominal pain, severe nausea/vomiting, or problems with urination or bowel movements unless otherwise stated above. Pertinent History Reviewed:  Reviewed past medical,surgical, social, obstetrical and family history.  Reviewed problem list, medications and allergies. Physical Assessment:   Vitals:   08/09/18 1516  BP: 114/77  Pulse: 95  Weight: 142 lb 8 oz (64.6 kg)  Body mass index is 22.32 kg/m.        Physical Examination:   General appearance: Well appearing, and in no distress  Mental status: Alert, oriented to person, place, and time  Skin: Warm & dry  Cardiovascular: Normal heart rate noted  Respiratory: Normal respiratory effort, no distress  Abdomen: Soft, gravid, nontender  Pelvic: Cervical exam deferred         Extremities: Edema: None  Fetal Status:     Movement: Present    Results for orders placed or performed in visit on 08/09/18 (from the past 24 hour(s))  POC Urinalysis Dipstick OB   Collection Time: 08/09/18  3:17 PM  Result Value Ref Range   Color, UA     Clarity, UA     Glucose, UA Negative Negative   Bilirubin, UA     Ketones, UA neg    Spec Grav, UA     Blood, UA neg    pH, UA     POC,PROTEIN,UA Negative Negative, Trace, Small (1+), Moderate (2+), Large (3+), 4+   Urobilinogen, UA     Nitrite,  UA neg    Leukocytes, UA Trace (A) Negative   Appearance     Odor      Assessment & Plan:  1) Low-risk pregnancy G1P0 at [redacted]w[redacted]d with an Estimated Date of Delivery: 12/18/18   2) LSIL/HPV pap, needs colpo   Meds:  Meds ordered this encounter  Medications  . Blood Pressure Monitor MISC    Sig: For regular home bp monitoring during pregnancy    Dispense:  1 each    Refill:  0    Dx: z34.0    Order Specific Question:   Supervising Provider    Answer:   Florian Buff [[id number]]   Labs/procedures today: none  Plan:  Continue routine obstetrical care   Reviewed: Preterm labor symptoms and general obstetric precautions including but not limited to vaginal bleeding, contractions, leaking of fluid and fetal movement were reviewed in detail with the patient.  All questions were answered  Follow-up: Return in about 4 weeks (around 09/06/2018) for [place] w/ MD w/ colpo; then 3wks later for LROB in person w/ PN2; cancel 7/14 visit please.  Orders Placed This Encounter  Procedures  . POC Urinalysis Dipstick OB   Roma Schanz CNM, [facility] 08/09/2018 3:51 PM

## 2018-08-09 NOTE — Patient Instructions (Addendum)
Zeeva Filter, I greatly value your feedback.  If you receive a survey following your visit with [place] today, we appreciate you taking the time to fill it out.  Thanks, [name], CNM, [facility]  [facility] HOSPITAL HAS MOVED!!! It is now [facility] ([address] [place], [place] [address]) Entrance located off of [address] 24/7 valet parking   Home Blood Pressure Monitoring for Patients   Your provider has recommended that you check your blood pressure (BP) at least once a week at home. If you do not have a blood pressure cuff at home, one will be provided for you. Contact your provider if you have not received your monitor within 1 week.   Helpful Tips for Accurate Home Blood Pressure Checks  . Don't smoke, exercise, or drink caffeine 30 minutes before checking your BP . Use the restroom before checking your BP (a full bladder can raise your pressure) . Relax in a comfortable upright chair . Feet on the ground . Left arm resting comfortably on a flat surface at the level of your heart . Legs uncrossed . Back supported . Sit quietly and don't talk . Place the cuff on your bare arm . Adjust snuggly, so that only two fingertips can fit between your skin and the top of the cuff . Check 2 readings separated by at least one minute . Keep a log of your BP readings . For a visual, please reference this diagram: http://ccnc.care/bpdiagram  Provider Name: [facility]     Phone: [phone]  Zone 1: ALL CLEAR  Continue to monitor your symptoms:  . BP reading is less than 140 (top number) or less than 90 (bottom number)  . No right upper stomach pain . No headaches or seeing spots . No feeling nauseated or throwing up . No swelling in face and hands  Zone 2: CAUTION Call your doctor's office for any of the following:  . BP reading is greater than 140 (top number) or greater than 90 (bottom number)  . Stomach pain under your ribs in the middle or  right side . Headaches or seeing spots . Feeling nauseated or throwing up . Swelling in face and hands  Zone 3: EMERGENCY  Seek immediate medical care if you have any of the following:  . BP reading is greater than160 (top number) or greater than 110 (bottom number) . Severe headaches not improving with Tylenol . Serious difficulty catching your breath . Any worsening symptoms from Zone 2  Online childbirth classes: [email or web address]   [place] Pediatricians/Family Doctors:  [facility] [phone]            [facility] [phone]                 [facility] Medicine [phone] (usually not accepting new patients unless you have family there already, you are always welcome to call and ask)       [facility] Department [phone]       [facility] Pediatricians/Family Doctors:   [facility]: [phone]  [facility]: [phone]  [facility]: [phone]  [facility] Doctors:   [facility]: [phone]   [facility]: [phone]  [facility] Doctors:  [facility]: [phone]  AREA PEDIATRIC/FAMILY PRACTICE PHYSICIANS  [facility] [address] [place], [place] [address] Phone - [phone]   Fax - [phone]  JACK AMOS [address]  [place], [place]  [address] Phone - [phone]   Fax - [phone]  [facility] [address] [place], [place]  [address] Phone - [phone]   Fax - [phone]  [facility] [address] [place], [place]  [address] Phone - [phone]   Fax - [phone]  [facility] [address] [place], [place]  [address] Phone - [phone]   Fax - [phone]  [facility] [address], [address] [place], [place]  [address] Phone - [phone]   Fax - [phone]   [facility] [address] [place], [place]  [address] Phone - [phone]   Fax - [phone]  [facility] [address] [place], [place]  [address] Phone - [phone]   Fax - [phone]  [facility] [address] [place], [place]  [address] Phone - [phone]   Fax - [phone] [facility] [address] [place], [place]  [address] Phone - [phone]   Fax - [phone]  [facility] [address] [place], [place]  [address] Phone - [phone]   Fax - [phone]  [facility] [address], [address] [place], [place]  [address] Phone - [phone]   Fax - [phone]  [facility] [address] [place], [place]  [address] Phone - [phone]   Fax - [phone]  [facility] [address], [address] [place], [place]  [address] Phone - [phone]  [facility] [address] [place], [place]  [address] Phone - [phone]   Fax - [phone]  [facility] [address], [address] [place], [place]  [address] Phone - [phone]   Fax - [phone]  [facility] [address] [place], [place]  [address] Phone - [phone]   Fax - [phone]  [facility] [address] [place], [place]  [address] Phone - [phone]   Fax - [phone]   [facility] [address], [address] [place], [place]  [address] Phone - [phone]   Fax - [phone]  [facility] [address], [address] [place], [place]  [address] Phone - [phone]   Fax - [phone]  DAVID RUBIN [address] [place], [place]  [address] Phone - [phone]   Fax - [phone]  [facility] [address] [place], [place]   [address] Phone - [phone]   Fax - [phone]  [facility] [address] [place], [place]  [address] Phone - [phone]   Fax - [phone] [facility] [place] [address] [place], [place]  [address] Phone - [phone]   Fax - [phone]  [facility] [address] [place], [place]  [address] Phone - [phone]   Fax - [phone]  [facility] [place] [address] [place], [place]  [address] Phone - [phone]   Fax - [phone]         Second Trimester of Pregnancy The second trimester is from week 14 through week 27 (months 4 through 6). The second trimester is often a time when you feel your best. Your body has adjusted to being pregnant, and you begin to feel better physically. Usually, morning sickness has lessened or quit completely, you may have more energy, and you  may have an increase in appetite. The second trimester is also a time when the fetus is growing rapidly. At the end of the sixth month, the fetus is about 9 inches long and weighs about 1 pounds. You will likely begin to feel the baby move (quickening) between 16 and 20 weeks of pregnancy. Body changes during your second trimester Your body continues to go through many changes during your second trimester. The changes vary from woman to woman.  Your weight will continue to increase. You will notice your lower abdomen bulging out.  You may begin to get stretch marks on your hips, abdomen, and breasts.  You may develop headaches that can be relieved by medicines. The medicines should be approved by your health care provider.  You may urinate more often because the fetus is pressing on your bladder.  You may develop or continue to have heartburn as a result of your pregnancy.  You may develop constipation because certain hormones are causing the muscles that push waste through your intestines to slow down.  You may  develop hemorrhoids or swollen, bulging veins (varicose veins).  You may have back pain. This is caused by: ? Weight gain. ? Pregnancy hormones that are relaxing the joints in your pelvis. ? A shift in weight and the muscles that support your balance.  Your breasts will continue to grow and they will continue to become tender.  Your gums may bleed and may be sensitive to brushing and flossing.  Dark spots or blotches (chloasma, mask of pregnancy) may develop on your face. This will likely fade after the baby is born.  A dark line from your belly button to the pubic area (linea nigra) may appear. This will likely fade after the baby is born.  You may have changes in your hair. These can include thickening of your hair, rapid growth, and changes in texture. Some women also have hair loss during or after pregnancy, or hair that feels dry or thin. Your hair will most likely return to normal after your baby is born.  What to expect at prenatal visits During a routine prenatal visit:  You will be weighed to make sure you and the fetus are growing normally.  Your blood pressure will be taken.  Your abdomen will be measured to track your baby's growth.  The fetal heartbeat will be listened to.  Any test results from the previous visit will be discussed.  Your health care provider may ask you:  How you are feeling.  If you are feeling the baby move.  If you have had any abnormal symptoms, such as leaking fluid, bleeding, severe headaches, or abdominal cramping.  If you are using any tobacco products, including cigarettes, chewing tobacco, and electronic cigarettes.  If you have any questions.  Other tests that may be performed during your second trimester include:  Blood tests that check for: ? Low iron levels (anemia). ? High blood sugar that affects pregnant women (gestational diabetes) between 7 and 28 weeks. ? Rh antibodies. This is to check for a protein on red blood cells  (Rh factor).  Urine tests to check for infections, diabetes, or protein in the urine.  An ultrasound to confirm the proper growth and development of the baby.  An amniocentesis to check for possible genetic problems.  Fetal screens for spina bifida and Down syndrome.  HIV (human immunodeficiency virus) testing. Routine prenatal testing includes screening for HIV, unless you choose not to have this test.  Follow  these instructions at home: Medicines  Follow your health care provider's instructions regarding medicine use. Specific medicines may be either safe or unsafe to take during pregnancy.  Take a prenatal vitamin that contains at least 600 micrograms (mcg) of folic acid.  If you develop constipation, try taking a stool softener if your health care provider approves. Eating and drinking  Eat a balanced diet that includes fresh fruits and vegetables, whole grains, good sources of protein such as meat, eggs, or tofu, and low-fat dairy. Your health care provider will help you determine the amount of weight gain that is right for you.  Avoid raw meat and uncooked cheese. These carry germs that can cause birth defects in the baby.  If you have low calcium intake from food, talk to your health care provider about whether you should take a daily calcium supplement.  Limit foods that are high in fat and processed sugars, such as fried and sweet foods.  To prevent constipation: ? Drink enough fluid to keep your urine clear or pale yellow. ? Eat foods that are high in fiber, such as fresh fruits and vegetables, whole grains, and beans. Activity  Exercise only as directed by your health care provider. Most women can continue their usual exercise routine during pregnancy. Try to exercise for 30 minutes at least 5 days a week. Stop exercising if you experience uterine contractions.  Avoid heavy lifting, wear low heel shoes, and practice good posture.  A sexual relationship may be  continued unless your health care provider directs you otherwise. Relieving pain and discomfort  Wear a good support bra to prevent discomfort from breast tenderness.  Take warm sitz baths to soothe any pain or discomfort caused by hemorrhoids. Use hemorrhoid cream if your health care provider approves.  Rest with your legs elevated if you have leg cramps or low back pain.  If you develop varicose veins, wear support hose. Elevate your feet for 15 minutes, 3-4 times a day. Limit salt in your diet. Prenatal Care  Write down your questions. Take them to your prenatal visits.  Keep all your prenatal visits as told by your health care provider. This is important. Safety  Wear your seat belt at all times when driving.  Make a list of emergency phone numbers, including numbers for family, friends, the hospital, and police and fire departments. General instructions  Ask your health care provider for a referral to a local prenatal education class. Begin classes no later than the beginning of month 6 of your pregnancy.  Ask for help if you have counseling or nutritional needs during pregnancy. Your health care provider can offer advice or refer you to specialists for help with various needs.  Do not use hot tubs, steam rooms, or saunas.  Do not douche or use tampons or scented sanitary pads.  Do not cross your legs for long periods of time.  Avoid cat litter boxes and soil used by cats. These carry germs that can cause birth defects in the baby and possibly loss of the fetus by miscarriage or stillbirth.  Avoid all smoking, herbs, alcohol, and unprescribed drugs. Chemicals in these products can affect the formation and growth of the baby.  Do not use any products that contain nicotine or tobacco, such as cigarettes and e-cigarettes. If you need help quitting, ask your health care provider.  Visit your dentist if you have not gone yet during your pregnancy. Use a soft toothbrush to brush  your teeth and be gentle  when you floss. Contact a health care provider if:  You have dizziness.  You have mild pelvic cramps, pelvic pressure, or nagging pain in the abdominal area.  You have persistent nausea, vomiting, or diarrhea.  You have a bad smelling vaginal discharge.  You have pain when you urinate. Get help right away if:  You have a fever.  You are leaking fluid from your vagina.  You have spotting or bleeding from your vagina.  You have severe abdominal cramping or pain.  You have rapid weight gain or weight loss.  You have shortness of breath with chest pain.  You notice sudden or extreme swelling of your face, hands, ankles, feet, or legs.  You have not felt your baby move in over an hour.  You have severe headaches that do not go away when you take medicine.  You have vision changes. Summary  The second trimester is from week 14 through week 27 (months 4 through 6). It is also a time when the fetus is growing rapidly.  Your body goes through many changes during pregnancy. The changes vary from woman to woman.  Avoid all smoking, herbs, alcohol, and unprescribed drugs. These chemicals affect the formation and growth your baby.  Do not use any tobacco products, such as cigarettes, chewing tobacco, and e-cigarettes. If you need help quitting, ask your health care provider.  Contact your health care provider if you have any questions. Keep all prenatal visits as told by your health care provider. This is important. This information is not intended to replace advice given to you by your health care provider. Make sure you discuss any questions you have with your health care provider. Document Released: 02/11/2001 Document Revised: 07/26/2015 Document Reviewed: 04/20/2012 Elsevier Interactive Patient Education  2017 [place].   Colposcopy Colposcopy is a procedure to examine the lowest part of the uterus (cervix), the vagina, and the area around the  vaginal opening (vulva) for abnormalities or signs of disease. The procedure is done using a lighted microscope or magnifying lens (colposcope). If any unusual cells are found during the procedure, your health care provider may remove a tissue sample for testing (biopsy). A colposcopy may be done if you:  Have an abnormal Pap test. A Pap test is a screening test that is used to check for signs of cancer or infection of the vagina, cervix, and uterus.  Have a Pap smear test in which you test positive for high-risk HPV (human papillomavirus).  Have a sore or lesion on your cervix.  Have genital warts on your vulva, vagina, or cervix.  Took certain medicines while pregnant, such as diethylstilbestrol (DES).  Have pain during sexual intercourse.  Have vaginal bleeding, especially after sexual intercourse.  Need to have a cervical polyp removed.  Need to have a lost intrauterine device (IUD) string located. Let your health care provider know about:  Any allergies you have, including allergies to prescribed medicine, latex, or iodine.  All medicines you are taking, including vitamins, herbs, eye drops, creams, and over-the-counter medicines. Bring a list of all of your medicines to your appointment.  Any problems you or family members have had with anesthetic medicines.  Any blood disorders you have.  Any surgeries you have had.  Any medical conditions you have, such as pelvic inflammatory disease (PID) or endometrial disorder.  Any history of frequent fainting.  Your menstrual cycle and what form of birth control (contraception) you use.  Your medical history, including any prior cervical treatment.  Whether you are pregnant or may be pregnant. What are the risks? Generally, this is a safe procedure. However, problems may occur, including:  Pain.  Infection, which may include a fever, bad-smelling discharge, or pelvic pain.  Bleeding or discharge.  Misdiagnosis.   Fainting and vasovagal reactions, but this is rare.  Allergic reactions to medicines.  Damage to other structures or organs. What happens before the procedure?  If you have your menstrual period or will have it at the time of your procedure, tell your health care provider. A colposcopy typically is not done during menstruation.  Continue your contraceptive practices before and after the procedure.  For 24 hours before the colposcopy: ? Do not douche. ? Do not use tampons. ? Do not use medicines, creams, or suppositories in the vagina. ? Do not have sexual intercourse.  Ask your health care provider about: ? Changing or stopping your regular medicines. This is especially important if you are taking diabetes medicines or blood thinners. ? Taking medicines such as aspirin and ibuprofen. These medicines can thin your blood. Do not take these medicines before your procedure if your health care provider instructs you not to. It is likely that your health care provider will tell you to avoid taking aspirin or medicine that contains aspirin for 7 days before the procedure.  Follow instructions from your health care provider about eating or drinking restrictions. You will likely need to eat a regular diet the day of the procedure and not skip any meals.  You may have an exam or testing. A pregnancy test will be taken on the day of the procedure.  You may have a blood or urine sample taken.  Plan to have someone take you home from the hospital or clinic.  If you will be going home right after the procedure, plan to have someone with you for 24 hours. What happens during the procedure?  You will lie down on your back, with your feet in foot rests (stirrups).  A warmed and lubricated instrument (speculum) will be inserted into your vagina. The speculum will be used to hold apart the walls of your vagina so your health care provider can see your cervix and the inside of your vagina.  A cotton  swab will be used to place a small amount of liquid solution on the areas to be examined. This solution makes it easier to see abnormal cells. You may feel a slight burning during this part.  The colposcope will be used to scan the cervix with a bright white light. The colposcope will be held near your vulvaand will magnify your vulva, vagina, and cervix for easier examination.  Your health care provider may decide to take a biopsy. If so: ? You may be given medicine to numb the area (local anesthetic). ? Surgical instruments will be used to suck out mucus and cells through your vagina. ? You may feel mild pain while the tissue sample is removed. ? Bleeding may occur. A solution may be used to stop the bleeding. ? If a sample of tissue is needed from the inside of the cervix, a different procedure called endocervical curettage (ECC) may be completed. During this procedure, a curved instrument (curette) will be used to scrape cells from your cervix or the top of your cervix (endocervix).  Your health care provider will record the location of any abnormalities. The procedure may vary among health care providers and hospitals. What happens after the procedure?  You will lie down  and rest for a few minutes. You may be offered juice or cookies.  Your blood pressure, heart rate, breathing rate, and blood oxygen level will be monitored until any medicines you were given have worn off.  You may have to wear compression stockings. These stockings help to prevent blood clots and reduce swelling in your legs.  You may have some cramping in your abdomen. This should go away after a few minutes. This information is not intended to replace advice given to you by your health care provider. Make sure you discuss any questions you have with your health care provider. Document Released: 05/10/2002 Document Revised: 10/16/2015 Document Reviewed: 09/24/2015 Elsevier Interactive Patient Education  2019 [employer]

## 2018-08-17 ENCOUNTER — Encounter: Payer: PRIVATE HEALTH INSURANCE | Admitting: Obstetrics & Gynecology

## 2018-09-07 ENCOUNTER — Ambulatory Visit (INDEPENDENT_AMBULATORY_CARE_PROVIDER_SITE_OTHER): Payer: PRIVATE HEALTH INSURANCE | Admitting: Obstetrics & Gynecology

## 2018-09-07 ENCOUNTER — Other Ambulatory Visit: Payer: Self-pay

## 2018-09-07 ENCOUNTER — Encounter: Payer: Self-pay | Admitting: Obstetrics & Gynecology

## 2018-09-07 VITALS — BP 116/75 | HR 102 | Wt 140.5 lb

## 2018-09-07 DIAGNOSIS — R350 Frequency of micturition: Secondary | ICD-10-CM

## 2018-09-07 DIAGNOSIS — N870 Mild cervical dysplasia: Secondary | ICD-10-CM | POA: Diagnosis not present

## 2018-09-07 DIAGNOSIS — Z3402 Encounter for supervision of normal first pregnancy, second trimester: Secondary | ICD-10-CM

## 2018-09-07 DIAGNOSIS — Z3A25 25 weeks gestation of pregnancy: Secondary | ICD-10-CM

## 2018-09-07 DIAGNOSIS — Z331 Pregnant state, incidental: Secondary | ICD-10-CM

## 2018-09-07 DIAGNOSIS — Z1389 Encounter for screening for other disorder: Secondary | ICD-10-CM

## 2018-09-07 LAB — POCT URINALYSIS DIPSTICK OB
Blood, UA: NEGATIVE
Glucose, UA: NEGATIVE
Ketones, UA: NEGATIVE
Nitrite, UA: POSITIVE

## 2018-09-07 NOTE — Progress Notes (Signed)
LOW-RISK PREGNANCY VISIT Patient name: Alyssa HeadRanda Enge MRN [id number]  Date of birth: 12-Feb-1981 Chief Complaint:   Routine Prenatal Visit (colpo-LGSIL pap; + pressure; pain with urination)  History of Present Illness:   Alyssa Hodge is a 38 y.o. G1P0 female at 8062w3d with an Estimated Date of Delivery: 12/18/18 being seen today for ongoing management of a low-risk pregnancy.  Today she reports pelvic pressure. Contractions: Irregular. Vag. Bleeding: None.  Movement: Present. denies leaking of fluid. Review of Systems:   Pertinent items are noted in HPI Denies abnormal vaginal discharge w/ itching/odor/irritation, headaches, visual changes, shortness of breath, chest pain, abdominal pain, severe nausea/vomiting, or problems with urination or bowel movements unless otherwise stated above. Pertinent History Reviewed:  Reviewed past medical,surgical, social, obstetrical and family history.  Reviewed problem list, medications and allergies. Physical Assessment:   Vitals:   09/07/18 1050  BP: 116/75  Pulse: (!) 102  Weight: 140 lb 8 oz (63.7 kg)  Body mass index is 22.01 kg/m.        Physical Examination:   General appearance: Well appearing, and in no distress  Mental status: Alert, oriented to person, place, and time  Skin: Warm & dry  Cardiovascular: Normal heart rate noted  Respiratory: Normal respiratory effort, no distress  Abdomen: Soft, gravid, nontender  Pelvic: Cervical exam deferred         Extremities: Edema: None  Fetal Status:     Movement: Present    Results for orders placed or performed in visit on 09/07/18 (from the past 24 hour(s))  POC Urinalysis Dipstick OB   Collection Time: 09/07/18 10:52 AM  Result Value Ref Range   Color, UA     Clarity, UA     Glucose, UA Negative Negative   Bilirubin, UA     Ketones, UA neg    Spec Grav, UA     Blood, UA neg    pH, UA     POC,PROTEIN,UA Small (1+) Negative, Trace, Small (1+), Moderate (2+), Large (3+), 4+   Urobilinogen, UA     Nitrite, UA positive    Leukocytes, UA Trace (A) Negative   Appearance     Odor      Assessment & Plan:  1) Low-risk pregnancy G1P0 at 6162w3d with an Estimated Date of Delivery: 12/18/18   2) LSIL, lesion noted LSIL that will need post delivery evaluation   Meds: No orders of the defined types were placed in this encounter.  Labs/procedures today: colpsocopy  Colposcopy Procedure Note:  Colposcopy Procedure Note  Indications: Pap smear 1 months ago showed: low-grade squamous intraepithelial neoplasia (LGSIL - encompassing HPV,mild dysplasia,CIN I). The prior pap showed no abnormalities.  Prior cervical/vaginal disease: . Prior cervical treatment: .  Smoker:  No. New sexual partner:  No.    History of abnormal Pap: no  Procedure Details  The risks and benefits of the procedure and Written informed consent obtained.  Speculum placed in vagina and excellent visualization of cervix achieved, cervix swabbed x 3 with acetic acid solution.  Findings: Cervix: acetowhite lesion(s) noted at 4-8 o'clock of the posterior cervix o'clock and punctation noted at 4-8 o'clock; no biopsies taken. Vaginal inspection: normal without visible lesions. Vulvar colposcopy: vulvar colposcopy not performed.  Specimens: none  Complications: none.  Plan: Repeat colposcopy with biopsy 8 weeks or so post delivery     Plan:  Continue routine obstetrical care   Reviewed: Preterm labor symptoms and general obstetric precautions including but not limited to vaginal bleeding, contractions,  leaking of fluid and fetal movement were reviewed in detail with the patient.  All questions were answered.  home bp cuff. Rx faxed to . Check bp weekly, let us know if >140/90.   Follow-up: No follow-ups on file.  Orders Placed This Encounter  Procedures  . Urine Culture  . POC Urinalysis Dipstick OB   Alyssa Hodge 09/07/2018 11:08 AM

## 2018-09-09 LAB — URINE CULTURE

## 2018-09-10 ENCOUNTER — Telehealth: Payer: Self-pay | Admitting: Obstetrics & Gynecology

## 2018-09-10 MED ORDER — NITROFURANTOIN MONOHYD MACRO 100 MG PO CAPS: 100.0000 mg | ORAL_CAPSULE | Freq: Two times a day (BID) | ORAL | 0 refills | Status: DC

## 2018-09-14 ENCOUNTER — Other Ambulatory Visit: Payer: PRIVATE HEALTH INSURANCE

## 2018-09-14 ENCOUNTER — Encounter: Payer: PRIVATE HEALTH INSURANCE | Admitting: Women's Health

## 2018-09-28 ENCOUNTER — Other Ambulatory Visit: Payer: PRIVATE HEALTH INSURANCE

## 2018-09-28 ENCOUNTER — Encounter: Payer: PRIVATE HEALTH INSURANCE | Admitting: Obstetrics & Gynecology

## 2018-10-05 ENCOUNTER — Encounter: Payer: Self-pay | Admitting: Obstetrics & Gynecology

## 2018-10-05 ENCOUNTER — Ambulatory Visit (INDEPENDENT_AMBULATORY_CARE_PROVIDER_SITE_OTHER): Payer: PRIVATE HEALTH INSURANCE | Admitting: Obstetrics & Gynecology

## 2018-10-05 ENCOUNTER — Other Ambulatory Visit: Payer: PRIVATE HEALTH INSURANCE

## 2018-10-05 ENCOUNTER — Other Ambulatory Visit: Payer: Self-pay

## 2018-10-05 VITALS — BP 95/66 | HR 85 | Wt 143.0 lb

## 2018-10-05 DIAGNOSIS — Z3403 Encounter for supervision of normal first pregnancy, third trimester: Secondary | ICD-10-CM

## 2018-10-05 DIAGNOSIS — Z3A29 29 weeks gestation of pregnancy: Secondary | ICD-10-CM

## 2018-10-05 DIAGNOSIS — Z1389 Encounter for screening for other disorder: Secondary | ICD-10-CM

## 2018-10-05 DIAGNOSIS — Z331 Pregnant state, incidental: Secondary | ICD-10-CM

## 2018-10-05 LAB — POCT URINALYSIS DIPSTICK OB
Blood, UA: NEGATIVE
Glucose, UA: NEGATIVE
Ketones, UA: NEGATIVE
Leukocytes, UA: NEGATIVE
Nitrite, UA: NEGATIVE
POC,PROTEIN,UA: NEGATIVE

## 2018-10-05 NOTE — Progress Notes (Signed)
   LOW-RISK PREGNANCY VISIT Patient name: Alyssa Hodge MRN [id number]  Date of birth: Apr 08, 1980 Chief Complaint:   Routine Prenatal Visit (PN2)  History of Present Illness:   Alyssa Hodge is a 38 y.o. G1P0 female at [redacted]w[redacted]d with an Estimated Date of Delivery: 12/18/18 being seen today for ongoing management of a low-risk pregnancy.  Today she reports no complaints. Contractions: Regular.  .  Movement: Present. denies leaking of fluid. Review of Systems:   Pertinent items are noted in HPI Denies abnormal vaginal discharge w/ itching/odor/irritation, headaches, visual changes, shortness of breath, chest pain, abdominal pain, severe nausea/vomiting, or problems with urination or bowel movements unless otherwise stated above. Pertinent History Reviewed:  Reviewed past medical,surgical, social, obstetrical and family history.  Reviewed problem list, medications and allergies. Physical Assessment:   Vitals:   10/05/18 0926  BP: 95/66  Pulse: 85  Weight: 143 lb (64.9 kg)  Body mass index is 22.4 kg/m.        Physical Examination:   General appearance: Well appearing, and in no distress  Mental status: Alert, oriented to person, place, and time  Skin: Warm & dry  Cardiovascular: Normal heart rate noted  Respiratory: Normal respiratory effort, no distress  Abdomen: Soft, gravid, nontender  Pelvic: Cervical exam deferred         Extremities: Edema: None  Fetal Status: Fetal Heart Rate (bpm): 154 Fundal Height: 28 cm Movement: Present    Results for orders placed or performed in visit on 10/05/18 (from the past 24 hour(s))  POC Urinalysis Dipstick OB   Collection Time: 10/05/18  9:32 AM  Result Value Ref Range   Color, UA     Clarity, UA     Glucose, UA Negative Negative   Bilirubin, UA     Ketones, UA neg    Spec Grav, UA     Blood, UA neg    pH, UA     POC,PROTEIN,UA Negative Negative, Trace, Small (1+), Moderate (2+), Large (3+), 4+   Urobilinogen, UA     Nitrite, UA neg    Leukocytes, UA Negative Negative   Appearance     Odor      Assessment & Plan:  1) Low-risk pregnancy G1P0 at [redacted]w[redacted]d with an Estimated Date of Delivery: 12/18/18   2)  Cervical dysplasia, 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  Labs/procedures today: PN2  Plan:  Continue routine obstetrical care   Reviewed: Term labor symptoms and general obstetric precautions including but not limited to vaginal bleeding, contractions, leaking of fluid and fetal movement were reviewed in detail with the patient.  All questions were answered. Has home bp cuff Check bp weekly, let us know if >140/90.   Follow-up: Return in about 3 weeks (around 10/26/2018) for [place].  Orders Placed This Encounter  Procedures  . POC Urinalysis Dipstick OB   Mertie Clause   10/05/2018 10:01 AM

## 2018-10-06 LAB — CBC
Hematocrit: 36.4 % (ref 34.0–46.6)
Hemoglobin: 12.1 g/dL (ref 11.1–15.9)
MCH: 27.4 pg (ref 26.6–33.0)
MCHC: 33.2 g/dL (ref 31.5–35.7)
MCV: 82 fL (ref 79–97)
Platelets: 284 10*3/uL (ref 150–450)
RBC: 4.42 x10E6/uL (ref 3.77–5.28)
RDW: 12 % (ref 11.7–15.4)
WBC: 14.4 10*3/uL — ABNORMAL HIGH (ref 3.4–10.8)

## 2018-10-06 LAB — HIV ANTIBODY (ROUTINE TESTING W REFLEX): HIV Screen 4th Generation wRfx: NONREACTIVE

## 2018-10-06 LAB — GLUCOSE TOLERANCE, 2 HOURS W/ 1HR
Glucose, 1 hour: 138 mg/dL (ref 65–179)
Glucose, 2 hour: 111 mg/dL (ref 65–152)
Glucose, Fasting: 85 mg/dL (ref 65–91)

## 2018-10-06 LAB — RPR: RPR Ser Ql: NONREACTIVE

## 2018-10-06 LAB — ANTIBODY SCREEN: Antibody Screen: NEGATIVE

## 2018-10-26 ENCOUNTER — Encounter: Payer: Self-pay | Admitting: Women's Health

## 2018-10-26 ENCOUNTER — Ambulatory Visit (INDEPENDENT_AMBULATORY_CARE_PROVIDER_SITE_OTHER): Payer: PRIVATE HEALTH INSURANCE

## 2018-10-26 ENCOUNTER — Ambulatory Visit (INDEPENDENT_AMBULATORY_CARE_PROVIDER_SITE_OTHER): Payer: PRIVATE HEALTH INSURANCE | Admitting: Women's Health

## 2018-10-26 ENCOUNTER — Other Ambulatory Visit: Payer: Self-pay

## 2018-10-26 VITALS — BP 122/80 | HR 98 | Wt 143.5 lb

## 2018-10-26 DIAGNOSIS — Z3A32 32 weeks gestation of pregnancy: Secondary | ICD-10-CM | POA: Diagnosis not present

## 2018-10-26 DIAGNOSIS — O26843 Uterine size-date discrepancy, third trimester: Secondary | ICD-10-CM

## 2018-10-26 DIAGNOSIS — Z331 Pregnant state, incidental: Secondary | ICD-10-CM

## 2018-10-26 DIAGNOSIS — Z1389 Encounter for screening for other disorder: Secondary | ICD-10-CM

## 2018-10-26 DIAGNOSIS — Z23 Encounter for immunization: Secondary | ICD-10-CM | POA: Diagnosis not present

## 2018-10-26 DIAGNOSIS — Z3403 Encounter for supervision of normal first pregnancy, third trimester: Secondary | ICD-10-CM

## 2018-10-26 LAB — POCT URINALYSIS DIPSTICK OB
Glucose, UA: NEGATIVE
Ketones, UA: NEGATIVE
Nitrite, UA: NEGATIVE
POC,PROTEIN,UA: NEGATIVE

## 2018-10-26 MED ORDER — PROMETHAZINE HCL 25 MG PO TABS: 12.5000 mg | ORAL_TABLET | Freq: Four times a day (QID) | ORAL | 0 refills | Status: DC | PRN

## 2018-10-26 NOTE — Progress Notes (Signed)
[place] 14+1 wks,cephalic,cx 3.9 cm,left lateral placenta gr 3,afi 12.5 cm,fhr 161 bpm,efw 1965 g 39%

## 2018-10-26 NOTE — Patient Instructions (Signed)
Alyssa Hodge, I greatly value your feedback.  If you receive a survey following your visit with us today, we appreciate you taking the time to fill it out.  Thanks, Alyssa Hodge, CNM, [facility]  [facility]!!! It is now [facility] at [facility] ([address] [place], [place] [address]) Entrance located off of [address] Free 24/7 valet parking    Go to [email or web address] to register for FREE online childbirth classes   Call the office [phone]) or go to [facility] if:  You begin to have strong, frequent contractions  Your water breaks.  Sometimes it is a big gush of fluid, sometimes it is just a trickle that keeps getting your panties wet or running down your legs  You have vaginal bleeding.  It is normal to have a small amount of spotting if your cervix was checked.   You don't feel your baby moving like normal.  If you don't, get you something to eat and drink and lay down and focus on feeling your baby move.  You should feel at least 10 movements in 2 hours.  If you don't, you should call the office or go to [facility].    Tdap Vaccine  It is recommended that you get the Tdap vaccine during the third trimester of EACH pregnancy to help protect your baby from getting pertussis (whooping cough)  27-36 weeks is the BEST time to do this so that you can pass the protection on to your baby. During pregnancy is better than after pregnancy, but if you are unable to get it during pregnancy it will be offered at the hospital.   You can get this vaccine with us, at the health department, your family doctor, or some local pharmacies  Everyone who will be around your baby should also be up-to-date on their vaccines before the baby comes. Adults (who are not pregnant) only need 1 dose of Tdap during adulthood.   [place] Pediatricians/Family Doctors:  [facility] [phone]            [facility] [phone]                  [facility] [phone] (usually not accepting new patients unless you have family there already, you are always welcome to call and ask)       [facility] [phone]       [facility]/Family Doctors:   [facility]: [phone]  [facility]: [phone]  [facility]: [phone]  [facility]:   [facility]: [phone]   [facility]: [phone]  [facility]:  [facility]: [phone]   Home Blood Pressure Monitoring for Patients   Your provider has recommended that you check your blood pressure (BP) at least once a week at home. If you do not have a blood pressure cuff at home, one will be provided for you. Contact your provider if you have not received your monitor within 1 week.   Helpful Tips for Accurate Home Blood Pressure Checks  . Don't smoke, exercise, or drink caffeine 30 minutes before checking your BP . Use the restroom before checking your BP (a full bladder can raise your pressure) . Relax in a comfortable upright chair . Feet on the ground . Left arm resting comfortably on a flat surface at the level of your heart . Legs uncrossed . Back supported . Sit quietly and don't talk .  Place the cuff on your bare arm . Adjust snuggly, so that only two fingertips can fit between your skin and the top of the cuff . Check 2 readings separated by at least one minute . Keep a log of your BP readings . For a visual, please reference this diagram: http://ccnc.care/bpdiagram  Provider Name: [facility]     Phone: [phone]  Zone 1: ALL CLEAR  Continue to monitor your symptoms:  . BP reading is less than 140 (top number) or less than 90 (bottom number)  . No right upper stomach pain . No headaches or seeing spots . No feeling nauseated or throwing up . No swelling in face and  hands  Zone 2: CAUTION Call your doctor's office for any of the following:  . BP reading is greater than 140 (top number) or greater than 90 (bottom number)  . Stomach pain under your ribs in the middle or right side . Headaches or seeing spots . Feeling nauseated or throwing up . Swelling in face and hands  Zone 3: EMERGENCY  Seek immediate medical care if you have any of the following:  . BP reading is greater than160 (top number) or greater than 110 (bottom number) . Severe headaches not improving with Tylenol . Serious difficulty catching your breath . Any worsening symptoms from Zone 2   Third Trimester of Pregnancy The third trimester is from week 29 through week 42, months 7 through 9. The third trimester is a time when the fetus is growing rapidly. At the end of the ninth month, the fetus is about 20 inches in length and weighs 6-10 pounds.  BODY CHANGES Your body goes through many changes during pregnancy. The changes vary from woman to woman.   Your weight will continue to increase. You can expect to gain 25-35 pounds (11-16 kg) by the end of the pregnancy.  You may begin to get stretch marks on your hips, abdomen, and breasts.  You may urinate more often because the fetus is moving lower into your pelvis and pressing on your bladder.  You may develop or continue to have heartburn as a result of your pregnancy.  You may develop constipation because certain hormones are causing the muscles that push waste through your intestines to slow down.  You may develop hemorrhoids or swollen, bulging veins (varicose veins).  You may have pelvic pain because of the weight gain and pregnancy hormones relaxing your joints between the bones in your pelvis. Backaches may result from overexertion of the muscles supporting your posture.  You may have changes in your hair. These can include thickening of your hair, rapid growth, and changes in texture. Some women also have hair loss during  or after pregnancy, or hair that feels dry or thin. Your hair will most likely return to normal after your baby is born.  Your breasts will continue to grow and be tender. A yellow discharge may leak from your breasts called colostrum.  Your belly button may stick out.  You may feel short of breath because of your expanding uterus.  You may notice the fetus "dropping," or moving lower in your abdomen.  You may have a bloody mucus discharge. This usually occurs a few days to a week before labor begins.  Your cervix becomes thin and soft (effaced) near your due date. WHAT TO EXPECT AT YOUR PRENATAL EXAMS  You will have prenatal exams every 2 weeks until week 36. Then, you will have weekly prenatal exams. During  a routine prenatal visit:  You will be weighed to make sure you and the fetus are growing normally.  Your blood pressure is taken.  Your abdomen will be measured to track your baby's growth.  The fetal heartbeat will be listened to.  Any test results from the previous visit will be discussed.  You may have a cervical check near your due date to see if you have effaced. At around 36 weeks, your caregiver will check your cervix. At the same time, your caregiver will also perform a test on the secretions of the vaginal tissue. This test is to determine if a type of bacteria, Group B streptococcus, is present. Your caregiver will explain this further. Your caregiver may ask you:  What your birth plan is.  How you are feeling.  If you are feeling the baby move.  If you have had any abnormal symptoms, such as leaking fluid, bleeding, severe headaches, or abdominal cramping.  If you have any questions. Other tests or screenings that may be performed during your third trimester include:  Blood tests that check for low iron levels (anemia).  Fetal testing to check the health, activity level, and growth of the fetus. Testing is done if you have certain medical conditions or if  there are problems during the pregnancy. FALSE LABOR You may feel small, irregular contractions that eventually go away. These are called Braxton Hicks contractions, or false labor. Contractions may last for hours, days, or even weeks before true labor sets in. If contractions come at regular intervals, intensify, or become painful, it is best to be seen by your caregiver.  SIGNS OF LABOR   Menstrual-like cramps.  Contractions that are 5 minutes apart or less.  Contractions that start on the top of the uterus and spread down to the lower abdomen and back.  A sense of increased pelvic pressure or back pain.  A watery or bloody mucus discharge that comes from the vagina. If you have any of these signs before the 37th week of pregnancy, call your caregiver right away. You need to go to the hospital to get checked immediately. HOME CARE INSTRUCTIONS   Avoid all smoking, herbs, alcohol, and unprescribed drugs. These chemicals affect the formation and growth of the baby.  Follow your caregiver's instructions regarding medicine use. There are medicines that are either safe or unsafe to take during pregnancy.  Exercise only as directed by your caregiver. Experiencing uterine cramps is a good sign to stop exercising.  Continue to eat regular, healthy meals.  Wear a good support bra for breast tenderness.  Do not use hot tubs, steam rooms, or saunas.  Wear your seat belt at all times when driving.  Avoid raw meat, uncooked cheese, cat litter boxes, and soil used by cats. These carry germs that can cause birth defects in the baby.  Take your prenatal vitamins.  Try taking a stool softener (if your caregiver approves) if you develop constipation. Eat more high-fiber foods, such as fresh vegetables or fruit and whole grains. Drink plenty of fluids to keep your urine clear or pale yellow.  Take warm sitz baths to soothe any pain or discomfort caused by hemorrhoids. Use hemorrhoid cream if your  caregiver approves.  If you develop varicose veins, wear support hose. Elevate your feet for 15 minutes, 3-4 times a day. Limit salt in your diet.  Avoid heavy lifting, wear low heal shoes, and practice good posture.  Rest a lot with your legs elevated if you  have leg cramps or low back pain.  Visit your dentist if you have not gone during your pregnancy. Use a soft toothbrush to brush your teeth and be gentle when you floss.  A sexual relationship may be continued unless your caregiver directs you otherwise.  Do not travel far distances unless it is absolutely necessary and only with the approval of your caregiver.  Take prenatal classes to understand, practice, and ask questions about the labor and delivery.  Make a trial run to the hospital.  Pack your hospital bag.  Prepare the baby's nursery.  Continue to go to all your prenatal visits as directed by your caregiver. SEEK MEDICAL CARE IF:  You are unsure if you are in labor or if your water has broken.  You have dizziness.  You have mild pelvic cramps, pelvic pressure, or nagging pain in your abdominal area.  You have persistent nausea, vomiting, or diarrhea.  You have a bad smelling vaginal discharge.  You have pain with urination. SEEK IMMEDIATE MEDICAL CARE IF:   You have a fever.  You are leaking fluid from your vagina.  You have spotting or bleeding from your vagina.  You have severe abdominal cramping or pain.  You have rapid weight loss or gain.  You have shortness of breath with chest pain.  You notice sudden or extreme swelling of your face, hands, ankles, feet, or legs.  You have not felt your baby move in over an hour.  You have severe headaches that do not go away with medicine.  You have vision changes. Document Released: 02/11/2001 Document Revised: 02/22/2013 Document Reviewed: 04/20/2012 [facility] Patient Information 2015 [place], [place]. This information is not intended to replace advice  given to you by your health care provider. Make sure you discuss any questions you have with your health care provider.

## 2018-10-26 NOTE — Addendum Note (Signed)
Addended by: Linton Rump on: 10/26/2018 10:43 AM   Modules accepted: Orders

## 2018-10-26 NOTE — Progress Notes (Signed)
   LOW-RISK PREGNANCY VISIT Patient name: Jerald Villalona MRN [id number]  Date of birth: 1980-04-13 Chief Complaint:   Routine Prenatal Visit (vomiting last 3 days)  History of Present Illness:   Sheyna Pettibone is a 38 y.o. G1P0 female at [redacted]w[redacted]d with an Estimated Date of Delivery: 12/18/18 being seen today for ongoing management of a low-risk pregnancy.  Today she reports n/v x 3d, no diarrhea, wants meds. Contractions: Irregular. Vag. Bleeding: None.  Movement: Present. denies leaking of fluid. Review of Systems:   Pertinent items are noted in HPI Denies abnormal vaginal discharge w/ itching/odor/irritation, headaches, visual changes, shortness of breath, chest pain, abdominal pain, severe nausea/vomiting, or problems with urination or bowel movements unless otherwise stated above. Pertinent History Reviewed:  Reviewed past medical,surgical, social, obstetrical and family history.  Reviewed problem list, medications and allergies. Physical Assessment:   Vitals:   10/26/18 1015  BP: 122/80  Pulse: 98  Weight: 143 lb 8 oz (65.1 kg)  Body mass index is 22.48 kg/m.        Physical Examination:   General appearance: Well appearing, and in no distress  Mental status: Alert, oriented to person, place, and time  Skin: Warm & dry  Cardiovascular: Normal heart rate noted  Respiratory: Normal respiratory effort, no distress  Abdomen: Soft, gravid, nontender  Pelvic: Cervical exam deferred         Extremities: Edema: None  Fetal Status:     Movement: Present    Results for orders placed or performed in visit on 10/26/18 (from the past 24 hour(s))  POC Urinalysis Dipstick OB   Collection Time: 10/26/18 10:16 AM  Result Value Ref Range   Color, UA     Clarity, UA     Glucose, UA Negative Negative   Bilirubin, UA     Ketones, UA neg    Spec Grav, UA     Blood, UA 1+    pH, UA     POC,PROTEIN,UA Negative Negative, Trace, Small (1+), Moderate (2+), Large (3+), 4+   Urobilinogen, UA     Nitrite, UA neg    Leukocytes, UA Trace (A) Negative   Appearance     Odor      Assessment & Plan:  1) Low-risk pregnancy G1P0 at [redacted]w[redacted]d with an Estimated Date of Delivery: 12/18/18   2) N/V, rx phenergan, stay hydrated, reviewed dehydration s/s, reasons to seek care  3) Uterine size <dates- will get efw/afi u/s   Meds:  Meds ordered this encounter  Medications  . promethazine (PHENERGAN) 25 MG tablet    Sig: Take 0.5-1 tablets (12.5-25 mg total) by mouth every 6 (six) hours as needed for nausea or vomiting.    Dispense:  30 tablet    Refill:  0    Order Specific Question:   Supervising Provider    Answer:   Florian Buff [[id number]]   Labs/procedures today: tdap  Plan:  Continue routine obstetrical care   Reviewed: Preterm labor symptoms and general obstetric precautions including but not limited to vaginal bleeding, contractions, leaking of fluid and fetal movement were reviewed in detail with the patient.  All questions were answered.   Follow-up: Return for 1st available, US:EFW (no visit), then 2wks LROB in person.  Orders Placed This Encounter  Procedures  . US OB Follow Up  . POC Urinalysis Dipstick OB   Roma Schanz CNM, [facility] 10/26/2018 10:34 AM

## 2018-11-09 ENCOUNTER — Encounter: Payer: Self-pay | Admitting: Obstetrics and Gynecology

## 2018-11-09 ENCOUNTER — Other Ambulatory Visit: Payer: Self-pay

## 2018-11-09 ENCOUNTER — Ambulatory Visit (INDEPENDENT_AMBULATORY_CARE_PROVIDER_SITE_OTHER): Payer: PRIVATE HEALTH INSURANCE | Admitting: Obstetrics and Gynecology

## 2018-11-09 VITALS — BP 107/80 | HR 81 | Wt 143.2 lb

## 2018-11-09 DIAGNOSIS — Z3403 Encounter for supervision of normal first pregnancy, third trimester: Secondary | ICD-10-CM

## 2018-11-09 DIAGNOSIS — Z1389 Encounter for screening for other disorder: Secondary | ICD-10-CM

## 2018-11-09 DIAGNOSIS — Z3A34 34 weeks gestation of pregnancy: Secondary | ICD-10-CM

## 2018-11-09 DIAGNOSIS — Z331 Pregnant state, incidental: Secondary | ICD-10-CM

## 2018-11-09 LAB — POCT URINALYSIS DIPSTICK OB
Blood, UA: NEGATIVE
Glucose, UA: NEGATIVE
Ketones, UA: NEGATIVE
Leukocytes, UA: NEGATIVE
Nitrite, UA: NEGATIVE
POC,PROTEIN,UA: NEGATIVE

## 2018-11-09 NOTE — Progress Notes (Signed)
Patient ID: Anntionette Madkins, female   DOB: 07/23/1980, 38 y.o.   MRN: [id number]    LOW-RISK PREGNANCY VISIT Patient name: Mirranda Monrroy MRN [id number]  Date of birth: [birth year] Chief Complaint:   Routine Prenatal Visit  History of Present Illness:   Natarsha Hurwitz is a 38 y.o. G1P0 female at [redacted]w[redacted]d with an Estimated Date of Delivery: 12/18/18 being seen today for ongoing management of a low-risk pregnancy.  Husband present to interpret for pt. Today she reports occasional contractions unable to sleep at night. Contractions: Irregular. Vag. Bleeding: None.  Movement: Present. denies leaking of fluid. Review of Systems:   Pertinent items are noted in HPI Denies abnormal vaginal discharge w/ itching/odor/irritation, headaches, visual changes, shortness of breath, chest paiN, severe nausea/vomiting, or problems with urination or bowel movements unless otherwise stated above. Pertinent History Reviewed:  Reviewed past medical,surgical, social, obstetrical and family history.  Reviewed problem list, medications and allergies. Physical Assessment:   Vitals:   11/09/18 0945  BP: 107/80  Pulse: 81  Weight: 143 lb 3.2 oz (65 kg)  Body mass index is 22.43 kg/m.        Physical Examination:   General appearance: Well appearing, and in no distress  Mental status: Alert, oriented to person, place, and time  Skin: Warm & dry  Cardiovascular: Normal heart rate noted  Respiratory: Normal respiratory effort, no distress  Abdomen: Soft, gravid, nontender  Pelvic: Cervical exam deferred         Extremities: Edema: None  Fetal Status: Fetal Heart Rate (bpm): 152 Fundal Height: 32 cm Movement: Present    No results found for this or any previous visit (from the past 24 hour(s)).  Assessment & Plan:  1) Low-risk pregnancy G1P0 at [redacted]w[redacted]d with an Estimated Date of Delivery: 12/18/18    Meds: No orders of the defined types were placed in this encounter.  Labs/procedures today: None  Plan:   1. Continue  routine obstetrical care 2. F/u in 2 weeks in person, GBS/GCCHL  Reviewed: Preterm labor symptoms and general obstetric precautions including but not limited to vaginal bleeding, contractions, leaking of fluid and fetal movement were reviewed in detail with the patient.  All questions were answered., and pt given info on childbirth classes, and hospital info.   Follow-up: Return in about 2 weeks (around 11/23/2018) for GBS/GCCHL.  Orders Placed This Encounter  Procedures  . POC Urinalysis Dipstick OB   By signing my name below, I, Samul Dada, attest that this documentation has been prepared under the direction and in the presence of Jonnie Kind, MD. Electronically Signed: [place]. 11/09/18. 10:12 AM.  I personally performed the services described in this documentation, which was SCRIBED in my presence. The recorded information has been reviewed and considered accurate. It has been edited as necessary during review. Jonnie Kind, MD

## 2018-11-09 NOTE — Patient Instructions (Signed)
CHILDBIRTH CLASSES ° °[facility] °Call to Register: [phone] or [phone] or Register Online: [email or web address] ° °THESE CLASSES FILL UP VERY QUICKLY, SO SIGN UP AS SOON AS YOU CAN!!! ° °*Please visit [facility] website at [email or web address]* ° °Option 1: Birth & Baby Series °• This series of 3 weekly classes helps you and your labor partner prepare for childbirth at [facility]. °• Reviews newborn care, labor & birth, pain management, and comfort techniques °• Maternity Care Center Tour of [facility] is included. °• Cost: $60 per couple for insured or self-pay, $30 per couple for Medicaid ° °Option 2: Weekend Birth & Baby °• This class is a weekend version of our Birth & Baby series. It is designed for parents who have a difficult time fitting several weeks of classes into their schedule.  °• Maternity Care Center Tour of [facility] is included.  °• Friday 6:30pm-8:30pm, Saturday 9am-4pm °• Cost: $75 per couple for insured or self-pay, $30 per couple for Medicaid ° °Option 3: Natural Childbirth °• This series of 5 weekly classes is for expectant parents who want to learn and practice natural methods of coping with the process of labor and childbirth. °• Maternity Care Center Tour of [facility] is included. °• Cost: $75 per couple for insured or self-pay, $30 per couple for Medicaid ° °Option 4: Online Birth & Baby °• This online class offers you the freedom to complete a Birth & Baby series in the comfort of your own home. The flexibility of this option allows you to review sections at your own place, at times convenient to you and your support people.  °• Cost: $60 for 60 days of online access ° ° ° °Other Available Classes °Baby & Me °Enjoy this time to discuss newborn & infant parenting topics and family adjustment issues with other new mothers in a relaxed environment. Each week brings a new speaker or baby-centered activity. We encourage  mothers and their babies (birth to crawling) to join us every Thursday in the [facility] at 11:00 am. You are welcome to visit this group even if you haven't delivered yet! It's wonderful to make new friends early and watch other moms interact with their babies. No registration or fee.  ° °Big Brother/ Big Sister °Let your children share in the joy of a new brother or sister in this special class designed just for them. This class is designed for children ages 2-6, but any age is welcome. Please register each child individually. ° °Breastfeeding Support Group °This group is a mother-to-mother support circle where moms have the opportunity to share their breastfeeding experiences. A breastfeeding Support nurse is present for questions and concerns. Meets each Tuesday at 11:00 am. No fee or registration. ° °Breastfeeding Your Baby °Breastfeeding is a special time for mother and child. This class will help you feel ready to begin this important relationship. Your partner is encouraged to attend with you.  ° °Caring For Baby °This class is for expectant  and adoptive parents who want to learn and practice the most up-to-date newborn care for their babies. Register only the mom-to-be and your partner can come with you. (Note: This class is included in the Birth & Baby series and the Weekend Birth & Baby classes.) ° °Comfort Techniques & Tour °This 2-hour interactive class will provide you the opportunity to learn & practice hands-on techniques with your partner that can help relieve some discomfort of labor and encourage your baby to rotate toward   the best position for birth. A tour of the [facility] is included.  ° °Daddy Boot Camp °This course offers Dads-to-be the tools and knowledge needed to feel confident on their journey to becoming new fathers. ° °Grandparent Love °Expecting a grandbaby? Learn about the latest infant care and safety recommendation and ways to  support your own child as he or she transitions into the parenting role.  ° °Infant and Child CPR °Parents, grandparents, babysitters, and friends learn Cardio-Pulmonary Resuscitation skills for infants and children. Register each participant individually. (Note: This Family & Friends program does not offer certification.) ° °Marvelous Multiples °Expecting twins, triplets, or more? This class covers the differences in labor, birth, parenting, and breastfeeding issues that face multiples' parents. NICU tour is included.  ° °Mom Talk °This mom-led group offers support and connection to mothers as they journey through the adjustments and struggles of that sometimes overwhelming first year after the birth of a child. A member of our [facility] staff will be present to share resources and additional support if needed, as you care for yourself and baby. You are welcome to visit the group before you deliver! It's wonderful to meet new friends early and watch other moms interact with their babies. It's held at [facility] at 10:00 am each Tuesday morning and 6:00 pm each Thursday evening. Babies (birth to crawling) welcome. No registration or fee.  ° °Waterbirth Classes °Interested in a waterbirth? This informational class will help you discover whether waterbirth is the right fir for you.  ° °[facility] Virtual Maternity Tour °View a virtual tour of [facility]. In-person tours are available for participants of childbirth education classes.  ° °

## 2018-11-23 ENCOUNTER — Encounter: Payer: Self-pay | Admitting: Women's Health

## 2018-11-23 ENCOUNTER — Other Ambulatory Visit: Payer: Self-pay

## 2018-11-23 ENCOUNTER — Ambulatory Visit (INDEPENDENT_AMBULATORY_CARE_PROVIDER_SITE_OTHER): Payer: PRIVATE HEALTH INSURANCE | Admitting: Women's Health

## 2018-11-23 VITALS — BP 114/77 | HR 84 | Wt 144.2 lb

## 2018-11-23 DIAGNOSIS — Z1389 Encounter for screening for other disorder: Secondary | ICD-10-CM

## 2018-11-23 DIAGNOSIS — Z3A36 36 weeks gestation of pregnancy: Secondary | ICD-10-CM

## 2018-11-23 DIAGNOSIS — Z3403 Encounter for supervision of normal first pregnancy, third trimester: Secondary | ICD-10-CM

## 2018-11-23 DIAGNOSIS — Z130 Encounter for screening for diseases of the blood and blood-forming organs and certain disorders involving the immune mechanism: Secondary | ICD-10-CM

## 2018-11-23 DIAGNOSIS — Z331 Pregnant state, incidental: Secondary | ICD-10-CM

## 2018-11-23 DIAGNOSIS — O26843 Uterine size-date discrepancy, third trimester: Secondary | ICD-10-CM

## 2018-11-23 LAB — POCT URINALYSIS DIPSTICK OB
Blood, UA: NEGATIVE
Glucose, UA: NEGATIVE
Ketones, UA: NEGATIVE
Leukocytes, UA: NEGATIVE
Nitrite, UA: NEGATIVE
POC,PROTEIN,UA: NEGATIVE

## 2018-11-23 LAB — OB RESULTS CONSOLE GBS: GBS: NEGATIVE

## 2018-11-23 NOTE — Patient Instructions (Signed)
Alyssa Hodge, I greatly value your feedback.  If you receive a survey following your visit with us today, we appreciate you taking the time to fill it out.  °Thanks, °Alyssa Hodge, CNM, WHNP-BC ° °[facility]!!! °It is now [facility] °([address] [place], [place] [address]) °Entrance located off of [address] °Free 24/7 valet parking  ° °Go to [email or web address] to register for FREE online childbirth classes  °  °Call the office ([phone]) or go to [facility] if: °· You begin to have strong, frequent contractions °· Your water breaks.  Sometimes it is a big gush of fluid, sometimes it is just a trickle that keeps getting your panties wet or running down your legs °· You have vaginal bleeding.  It is normal to have a small amount of spotting if your cervix was checked.  °· You don't feel your baby moving like normal.  If you don't, get you something to eat and drink and lay down and focus on feeling your baby move.  You should feel at least 10 movements in 2 hours.  If you don't, you should call the office or go to [facility].  ° °Home Blood Pressure Monitoring for Patients  ° °Your provider has recommended that you check your blood pressure (BP) at least once a week at home. If you do not have a blood pressure cuff at home, one will be provided for you. Contact your provider if you have not received your monitor within 1 week.  ° °Helpful Tips for Accurate Home Blood Pressure Checks  °• Don't smoke, exercise, or drink caffeine 30 minutes before checking your BP °• Use the restroom before checking your BP (a full bladder can raise your pressure) °• Relax in a comfortable upright chair °• Feet on the ground °• Left arm resting comfortably on a flat surface at the level of your heart °• Legs uncrossed °• Back supported °• Sit quietly and don't talk °• Place the cuff on your bare arm °• Adjust snuggly, so that only two fingertips can fit between your skin and  the top of the cuff °• Check 2 readings separated by at least one minute °• Keep a log of your BP readings °• For a visual, please reference this diagram: http://[facility].care/bpdiagram ° °Provider Name: [facility]     Phone: [phone] ° °Zone 1: ALL CLEAR  °Continue to monitor your symptoms:  °• BP reading is less than 140 (top number) or less than 90 (bottom number)  °• No right upper stomach pain °• No headaches or seeing spots °• No feeling nauseated or throwing up °• No swelling in face and hands ° °Zone 2: CAUTION °Call your doctor's office for any of the following:  °• BP reading is greater than 140 (top number) or greater than 90 (bottom number)  °• Stomach pain under your ribs in the middle or right side °• Headaches or seeing spots °• Feeling nauseated or throwing up °• Swelling in face and hands ° °Zone 3: EMERGENCY  °Seek immediate medical care if you have any of the following:  °• BP reading is greater than160 (top number) or greater than 110 (bottom number) °• Severe headaches not improving with Tylenol °• Serious difficulty catching your breath °• Any worsening symptoms from Zone 2  ° ° °Braxton Hicks Contractions °Contractions of the uterus can occur throughout pregnancy, but they are not always a sign that you are in   labor. You may have practice contractions called Braxton Hicks contractions. These false labor contractions are sometimes confused with true labor. What are Alyssa Hodge contractions? Braxton Hicks contractions are tightening movements that occur in the muscles of the uterus before labor. Unlike true labor contractions, these contractions do not result in opening (dilation) and thinning of the cervix. Toward the end of pregnancy (32-34 weeks), Braxton Hicks contractions can happen more often and may become stronger. These contractions are sometimes difficult to tell apart from true labor because they can be very uncomfortable. You should not feel embarrassed if you go to the  hospital with false labor. Sometimes, the only way to tell if you are in true labor is for your health care provider to look for changes in the cervix. The health care provider will do a physical exam and may monitor your contractions. If you are not in true labor, the exam should show that your cervix is not dilating and your water has not broken. If there are no other health problems associated with your pregnancy, it is completely safe for you to be sent home with false labor. You may continue to have Braxton Hicks contractions until you go into true labor. How to tell the difference between true labor and false labor True labor  Contractions last 30-70 seconds.  Contractions become very regular.  Discomfort is usually felt in the top of the uterus, and it spreads to the lower abdomen and low back.  Contractions do not go away with walking.  Contractions usually become more intense and increase in frequency.  The cervix dilates and gets thinner. False labor  Contractions are usually shorter and not as strong as true labor contractions.  Contractions are usually irregular.  Contractions are often felt in the front of the lower abdomen and in the groin.  Contractions may go away when you walk around or change positions while lying down.  Contractions get weaker and are shorter-lasting as time goes on.  The cervix usually does not dilate or become thin. Follow these instructions at home:   Take over-the-counter and prescription medicines only as told by your health care provider.  Keep up with your usual exercises and follow other instructions from your health care provider.  Eat and drink lightly if you think you are going into labor.  If Braxton Hicks contractions are making you uncomfortable: ? Change your position from lying down or resting to walking, or change from walking to resting. ? Sit and rest in a tub of warm water. ? Drink enough fluid to keep your urine pale  yellow. Dehydration may cause these contractions. ? Do slow and deep breathing several times an hour.  Keep all follow-up prenatal visits as told by your health care provider. This is important. Contact a health care provider if:  You have a fever.  You have continuous pain in your abdomen. Get help right away if:  Your contractions become stronger, more regular, and closer together.  You have fluid leaking or gushing from your vagina.  You pass blood-tinged mucus (bloody show).  You have bleeding from your vagina.  You have low back pain that you never had before.  You feel your babys head pushing down and causing pelvic pressure.  Your baby is not moving inside you as much as it used to. Summary  Contractions that occur before labor are called Braxton Hicks contractions, false labor, or practice contractions.  Braxton Hicks contractions are usually shorter, weaker, farther apart, and less  regular than true labor contractions. True labor contractions usually become progressively stronger and regular, and they become more frequent.  Manage discomfort from [facility] contractions by changing position, resting in a warm bath, drinking plenty of water, or practicing deep breathing. This information is not intended to replace advice given to you by your health care provider. Make sure you discuss any questions you have with your health care provider. Document Released: 07/03/2016 Document Revised: 01/30/2017 Document Reviewed: 07/03/2016 Elsevier Patient Education  2020 [employer].

## 2018-11-23 NOTE — Progress Notes (Addendum)
   LOW-RISK PREGNANCY VISIT Patient name: Alyssa Hodge MRN [id number]  Date of birth: 12-Oct-1980 Chief Complaint:   Routine Prenatal Visit  History of Present Illness:   Alyssa Hodge is a 38 y.o. G1P0 female at [redacted]w[redacted]d with an Estimated Date of Delivery: 12/18/18 being seen today for ongoing management of a low-risk pregnancy.  Today she reports pressure. Contractions: Irregular. Vag. Bleeding: None.  Movement: Present. denies leaking of fluid. Review of Systems:   Pertinent items are noted in HPI Denies abnormal vaginal discharge w/ itching/odor/irritation, headaches, visual changes, shortness of breath, chest pain, abdominal pain, severe nausea/vomiting, or problems with urination or bowel movements unless otherwise stated above. Pertinent History Reviewed:  Reviewed past medical,surgical, social, obstetrical and family history.  Reviewed problem list, medications and allergies. Physical Assessment:   Vitals:   11/23/18 0949  BP: 114/77  Pulse: 84  Weight: 144 lb 3.2 oz (65.4 kg)  Body mass index is 22.58 kg/m.        Physical Examination:   General appearance: Well appearing, and in no distress  Mental status: Alert, oriented to person, place, and time  Skin: Warm & dry  Cardiovascular: Normal heart rate noted  Respiratory: Normal respiratory effort, no distress  Abdomen: Soft, gravid, nontender  Pelvic: Cervical exam performed  Dilation: Closed Effacement (%): Thick Station: -2  Extremities: Edema: None  Fetal Status: Fetal Heart Rate (bpm): 140 Fundal Height: 28 cm Movement: Present Presentation: Vertex  Results for orders placed or performed in visit on 11/23/18 (from the past 24 hour(s))  POC Urinalysis Dipstick OB   Collection Time: 11/23/18  9:50 AM  Result Value Ref Range   Color, UA     Clarity, UA     Glucose, UA Negative Negative   Bilirubin, UA     Ketones, UA neg    Spec Grav, UA     Blood, UA neg    pH, UA     POC,PROTEIN,UA Negative Negative, Trace, Small  (1+), Moderate (2+), Large (3+), 4+   Urobilinogen, UA     Nitrite, UA neg    Leukocytes, UA Negative Negative   Appearance     Odor      Assessment & Plan:  1) Low-risk pregnancy G1P0 at [redacted]w[redacted]d with an Estimated Date of Delivery: 12/18/18   2) Uterine size<dates, last EFW 39% @ 32wks, FH still 28cm   Meds: No orders of the defined types were placed in this encounter.  Labs/procedures today: gbs, gc/ct, sve  Plan:  Continue routine obstetrical care   Reviewed: Preterm labor symptoms and general obstetric precautions including but not limited to vaginal bleeding, contractions, leaking of fluid and fetal movement were reviewed in detail with the patient.  All questions were answered.   Follow-up: Return for ASAP, US:EFW then 1wk LROB in person.  Orders Placed This Encounter  Procedures  . GC/Chlamydia Probe Amp  . Culture, beta strep (group b only)  . US OB Follow Up  . CBC  . POC Urinalysis Dipstick OB   Roma Schanz CNM, [facility] 11/23/2018 10:13 AM

## 2018-11-24 ENCOUNTER — Ambulatory Visit (INDEPENDENT_AMBULATORY_CARE_PROVIDER_SITE_OTHER): Payer: PRIVATE HEALTH INSURANCE

## 2018-11-24 DIAGNOSIS — O26843 Uterine size-date discrepancy, third trimester: Secondary | ICD-10-CM

## 2018-11-24 DIAGNOSIS — Z3A36 36 weeks gestation of pregnancy: Secondary | ICD-10-CM | POA: Diagnosis not present

## 2018-11-24 DIAGNOSIS — Z3403 Encounter for supervision of normal first pregnancy, third trimester: Secondary | ICD-10-CM

## 2018-11-24 LAB — CBC
Hematocrit: 41.3 % (ref 34.0–46.6)
Hemoglobin: 13.5 g/dL (ref 11.1–15.9)
MCH: 26.8 pg (ref 26.6–33.0)
MCHC: 32.7 g/dL (ref 31.5–35.7)
MCV: 82 fL (ref 79–97)
Platelets: 317 10*3/uL (ref 150–450)
RBC: 5.03 x10E6/uL (ref 3.77–5.28)
RDW: 13.9 % (ref 11.7–15.4)
WBC: 12.4 10*3/uL — ABNORMAL HIGH (ref 3.4–10.8)

## 2018-11-24 NOTE — Progress Notes (Signed)
Patient ID: Alyssa Hodge, female   DOB: 1980/03/31, 38 y.o.   MRN: [id number] [place] 44+5 wks,cephalic,fhr 146 bpm,left lateral placenta gr 3,afi 9.7 cm,efw 2865 g 43%

## 2018-11-26 LAB — GC/CHLAMYDIA PROBE AMP
Chlamydia trachomatis, NAA: NEGATIVE
Neisseria Gonorrhoeae by PCR: NEGATIVE

## 2018-11-27 LAB — CULTURE, BETA STREP (GROUP B ONLY): Strep Gp B Culture: NEGATIVE

## 2018-11-30 ENCOUNTER — Encounter: Payer: PRIVATE HEALTH INSURANCE | Admitting: Obstetrics & Gynecology

## 2018-12-01 ENCOUNTER — Ambulatory Visit (INDEPENDENT_AMBULATORY_CARE_PROVIDER_SITE_OTHER): Payer: PRIVATE HEALTH INSURANCE | Admitting: Women's Health

## 2018-12-01 ENCOUNTER — Other Ambulatory Visit: Payer: Self-pay

## 2018-12-01 ENCOUNTER — Encounter: Payer: Self-pay | Admitting: Women's Health

## 2018-12-01 VITALS — BP 111/80 | HR 82

## 2018-12-01 DIAGNOSIS — Z331 Pregnant state, incidental: Secondary | ICD-10-CM

## 2018-12-01 DIAGNOSIS — Z3A37 37 weeks gestation of pregnancy: Secondary | ICD-10-CM

## 2018-12-01 DIAGNOSIS — Z3403 Encounter for supervision of normal first pregnancy, third trimester: Secondary | ICD-10-CM

## 2018-12-01 DIAGNOSIS — Z1389 Encounter for screening for other disorder: Secondary | ICD-10-CM

## 2018-12-01 LAB — POCT URINALYSIS DIPSTICK OB
Blood, UA: NEGATIVE
Glucose, UA: NEGATIVE
Ketones, UA: NEGATIVE
Leukocytes, UA: NEGATIVE
Nitrite, UA: NEGATIVE
POC,PROTEIN,UA: NEGATIVE

## 2018-12-01 NOTE — Progress Notes (Signed)
   LOW-RISK PREGNANCY VISIT Patient name: Alyssa Hodge MRN [id number]  Date of birth: 1980-09-17 Chief Complaint:   Routine Prenatal Visit  History of Present Illness:   Alyssa Hodge is a 38 y.o. G1P0 female at [redacted]w[redacted]d with an Estimated Date of Delivery: 12/18/18 being seen today for ongoing management of a low-risk pregnancy.  Today she reports no complaints. Contractions: Irregular. Vag. Bleeding: None.  Movement: Present. denies leaking of fluid. Review of Systems:   Pertinent items are noted in HPI Denies abnormal vaginal discharge w/ itching/odor/irritation, headaches, visual changes, shortness of breath, chest pain, abdominal pain, severe nausea/vomiting, or problems with urination or bowel movements unless otherwise stated above. Pertinent History Reviewed:  Reviewed past medical,surgical, social, obstetrical and family history.  Reviewed problem list, medications and allergies. Physical Assessment:   Vitals:   12/01/18 1040  BP: 111/80  Pulse: 82  There is no height or weight on file to calculate BMI.        Physical Examination:   General appearance: Well appearing, and in no distress  Mental status: Alert, oriented to person, place, and time  Skin: Warm & dry  Cardiovascular: Normal heart rate noted  Respiratory: Normal respiratory effort, no distress  Abdomen: Soft, gravid, nontender  Pelvic: Cervical exam deferred         Extremities: Edema: None  Fetal Status: Fetal Heart Rate (bpm): 143 Fundal Height: 29 cm Movement: Present    Results for orders placed or performed in visit on 12/01/18 (from the past 24 hour(s))  POC Urinalysis Dipstick OB   Collection Time: 12/01/18 10:46 AM  Result Value Ref Range   Color, UA     Clarity, UA     Glucose, UA Negative Negative   Bilirubin, UA     Ketones, UA neg    Spec Grav, UA     Blood, UA neg    pH, UA     POC,PROTEIN,UA Negative Negative, Trace, Small (1+), Moderate (2+), Large (3+), 4+   Urobilinogen, UA     Nitrite, UA  neg    Leukocytes, UA Negative Negative   Appearance     Odor      Assessment & Plan:  1) Low-risk pregnancy G1P0 at [redacted]w[redacted]d with an Estimated Date of Delivery: 12/18/18   2) Size <dates, efw 43% last week, AFI normal   Meds: No orders of the defined types were placed in this encounter.  Labs/procedures today: none  Plan:  Continue routine obstetrical care   Reviewed: Term labor symptoms and general obstetric precautions including but not limited to vaginal bleeding, contractions, leaking of fluid and fetal movement were reviewed in detail with the patient.  All questions were answered.   Follow-up: Return in about 1 week (around 12/08/2018) for [place], in person.  Orders Placed This Encounter  Procedures  . POC Urinalysis Dipstick OB   Roma Schanz CNM, [facility] 12/01/2018 11:02 AM

## 2018-12-01 NOTE — Patient Instructions (Signed)
Alyssa Hodge, I greatly value your feedback.  If you receive a survey following your visit with [place] today, we appreciate you taking the time to fill it out.  Thanks, Knute Neu, CNM, [facility]  [place]!!! It is now [place] at [facility] ([place], [place] [address]) Entrance located off of [address] parking   Go to [email or web address] to register for FREE online childbirth classes    Call the office [phone]) or go to [facility] if:  You begin to have strong, frequent contractions  Your water breaks.  Sometimes it is a big gush of fluid, sometimes it is just a trickle that keeps getting your panties wet or running down your legs  You have vaginal bleeding.  It is normal to have a small amount of spotting if your cervix was checked.   You don't feel your baby moving like normal.  If you don't, get you something to eat and drink and lay down and focus on feeling your baby move.  You should feel at least 10 movements in 2 hours.  If you don't, you should call the office or go to [facility] Blood Pressure Monitoring for Patients   Your provider has recommended that you check your blood pressure (BP) at least once a week at home. If you do not have a blood pressure cuff at home, one will be provided for you. Contact your provider if you have not received your monitor within 1 week.   Helpful Tips for Accurate Home Blood Pressure Checks   Don't smoke, exercise, or drink caffeine 30 minutes before checking your BP  Use the restroom before checking your BP (a full bladder can raise your pressure)  Relax in a comfortable upright chair  Feet on the ground  Left arm resting comfortably on a flat surface at the level of your heart  Legs uncrossed  Back supported  Sit quietly and don't talk  Place the cuff on your bare arm  Adjust snuggly, so that only two fingertips can fit between your skin and  the top of the cuff  Check 2 readings separated by at least one minute  Keep a log of your BP readings  For a visual, please reference this diagram: http://ccnc.care/bpdiagram  Provider Name: [facility]     Phone: [phone]  Zone 1: ALL CLEAR  Continue to monitor your symptoms:   BP reading is less than 140 (top number) or less than 90 (bottom number)   No right upper stomach pain  No headaches or seeing spots  No feeling nauseated or throwing up  No swelling in face and hands  Zone 2: CAUTION Call your doctor's office for any of the following:   BP reading is greater than 140 (top number) or greater than 90 (bottom number)   Stomach pain under your ribs in the middle or right side  Headaches or seeing spots  Feeling nauseated or throwing up  Swelling in face and hands  Zone 3: EMERGENCY  Seek immediate medical care if you have any of the following:   BP reading is greater than160 (top number) or greater than 110 (bottom number)  Severe headaches not improving with Tylenol  Serious difficulty catching your breath  Any worsening symptoms from Zone 2    Braxton Hicks Contractions Contractions of the uterus can occur throughout pregnancy, but they are not always a sign that you are in  labor. You may have practice contractions called Braxton Hicks contractions. These false labor contractions are sometimes confused with true labor. What are [name] contractions? Braxton Hicks contractions are tightening movements that occur in the muscles of the uterus before labor. Unlike true labor contractions, these contractions do not result in opening (dilation) and thinning of the cervix. Toward the end of pregnancy (32-34 weeks), Braxton Hicks contractions can happen more often and may become stronger. These contractions are sometimes difficult to tell apart from true labor because they can be very uncomfortable. You should not feel embarrassed if you go to the  hospital with false labor. Sometimes, the only way to tell if you are in true labor is for your health care provider to look for changes in the cervix. The health care provider will do a physical exam and may monitor your contractions. If you are not in true labor, the exam should show that your cervix is not dilating and your water has not broken. If there are no other health problems associated with your pregnancy, it is completely safe for you to be sent home with false labor. You may continue to have Braxton Hicks contractions until you go into true labor. How to tell the difference between true labor and false labor True labor  Contractions last 30-70 seconds.  Contractions become very regular.  Discomfort is usually felt in the top of the uterus, and it spreads to the lower abdomen and low back.  Contractions do not go away with walking.  Contractions usually become more intense and increase in frequency.  The cervix dilates and gets thinner. False labor  Contractions are usually shorter and not as strong as true labor contractions.  Contractions are usually irregular.  Contractions are often felt in the front of the lower abdomen and in the groin.  Contractions may go away when you walk around or change positions while lying down.  Contractions get weaker and are shorter-lasting as time goes on.  The cervix usually does not dilate or become thin. Follow these instructions at home:   Take over-the-counter and prescription medicines only as told by your health care provider.  Keep up with your usual exercises and follow other instructions from your health care provider.  Eat and drink lightly if you think you are going into labor.  If Braxton Hicks contractions are making you uncomfortable: ? Change your position from lying down or resting to walking, or change from walking to resting. ? Sit and rest in a tub of warm water. ? Drink enough fluid to keep your urine pale  yellow. Dehydration may cause these contractions. ? Do slow and deep breathing several times an hour.  Keep all follow-up prenatal visits as told by your health care provider. This is important. Contact a health care provider if:  You have a fever.  You have continuous pain in your abdomen. Get help right away if:  Your contractions become stronger, more regular, and closer together.  You have fluid leaking or gushing from your vagina.  You pass blood-tinged mucus (bloody show).  You have bleeding from your vagina.  You have low back pain that you never had before.  You feel your babys head pushing down and causing pelvic pressure.  Your baby is not moving inside you as much as it used to. Summary  Contractions that occur before labor are called Braxton Hicks contractions, false labor, or practice contractions.  Braxton Hicks contractions are usually shorter, weaker, farther apart, and less  regular than true labor contractions. True labor contractions usually become progressively stronger and regular, and they become more frequent.  Manage discomfort from [facility] contractions by changing position, resting in a warm bath, drinking plenty of water, or practicing deep breathing. This information is not intended to replace advice given to you by your health care provider. Make sure you discuss any questions you have with your health care provider. Document Released: 07/03/2016 Document Revised: 01/30/2017 Document Reviewed: 07/03/2016 Elsevier Patient Education  2020 [employer].

## 2018-12-07 ENCOUNTER — Ambulatory Visit (INDEPENDENT_AMBULATORY_CARE_PROVIDER_SITE_OTHER): Payer: PRIVATE HEALTH INSURANCE | Admitting: Obstetrics & Gynecology

## 2018-12-07 ENCOUNTER — Other Ambulatory Visit: Payer: Self-pay

## 2018-12-07 ENCOUNTER — Encounter: Payer: Self-pay | Admitting: Obstetrics & Gynecology

## 2018-12-07 ENCOUNTER — Encounter: Payer: PRIVATE HEALTH INSURANCE | Admitting: Obstetrics & Gynecology

## 2018-12-07 VITALS — BP 123/88 | HR 96 | Wt 147.0 lb

## 2018-12-07 DIAGNOSIS — Z331 Pregnant state, incidental: Secondary | ICD-10-CM

## 2018-12-07 DIAGNOSIS — Z3A38 38 weeks gestation of pregnancy: Secondary | ICD-10-CM

## 2018-12-07 DIAGNOSIS — Z23 Encounter for immunization: Secondary | ICD-10-CM

## 2018-12-07 DIAGNOSIS — Z1389 Encounter for screening for other disorder: Secondary | ICD-10-CM

## 2018-12-07 DIAGNOSIS — Z3403 Encounter for supervision of normal first pregnancy, third trimester: Secondary | ICD-10-CM

## 2018-12-07 LAB — POCT URINALYSIS DIPSTICK OB
Blood, UA: NEGATIVE
Glucose, UA: NEGATIVE
Ketones, UA: NEGATIVE
Leukocytes, UA: NEGATIVE
Nitrite, UA: NEGATIVE
POC,PROTEIN,UA: NEGATIVE

## 2018-12-07 NOTE — Progress Notes (Signed)
Patient ID: Alyssa Hodge, female   DOB: [birth year], 38 y.o.   MRN: [id number]   LOW-RISK PREGNANCY VISIT Patient name: Alyssa Hodge MRN [id number]  Date of birth: [birth year] Chief Complaint:   Routine Prenatal Visit  History of Present Illness:   Alyssa Hodge is a 38 y.o. G1P0 female at [redacted]w[redacted]d with an Estimated Date of Delivery: 12/18/18 being seen today for ongoing management of a low-risk pregnancy.  Today she reports no complaints. Contractions: Irregular. Vag. Bleeding: None.  Movement: Present. denies leaking of fluid. Review of Systems:   Pertinent items are noted in HPI Denies abnormal vaginal discharge w/ itching/odor/irritation, headaches, visual changes, shortness of breath, chest pain, abdominal pain, severe nausea/vomiting, or problems with urination or bowel movements unless otherwise stated above. Pertinent History Reviewed:  Reviewed past medical,surgical, social, obstetrical and family history.  Reviewed problem list, medications and allergies. Physical Assessment:   Vitals:   12/07/18 0930  BP: 123/88  Pulse: 96  Weight: 147 lb (66.7 kg)  Body mass index is 23.02 kg/m.        Physical Examination:   General appearance: Well appearing, and in no distress  Mental status: Alert, oriented to person, place, and time  Skin: Warm & dry  Cardiovascular: Normal heart rate noted  Respiratory: Normal respiratory effort, no distress  Abdomen: Soft, gravid, nontender  Pelvic: Cervical exam deferred         Extremities: Edema: None  Fetal Status: Fetal Heart Rate (bpm): 148 Fundal Height: 34 cm Movement: Present Presentation: Vertex  Results for orders placed or performed in visit on 12/07/18 (from the past 24 hour(s))  POC Urinalysis Dipstick OB   Collection Time: 12/07/18  9:44 AM  Result Value Ref Range   Color, UA     Clarity, UA     Glucose, UA Negative Negative   Bilirubin, UA     Ketones, UA n    Spec Grav, UA     Blood, UA n    pH, UA     POC,PROTEIN,UA Negative  Negative, Trace, Small (1+), Moderate (2+), Large (3+), 4+   Urobilinogen, UA     Nitrite, UA n    Leukocytes, UA Negative Negative   Appearance     Odor      Assessment & Plan:  1) Low-risk pregnancy G1P0 at [redacted]w[redacted]d with an Estimated Date of Delivery: 12/18/18   2) S<D but sonogram 43% at 36 weeks   Meds: No orders of the defined types were placed in this encounter.  Labs/procedures today:   Plan:  Continue routine obstetrical care   Reviewed: Term labor symptoms and general obstetric precautions including but not limited to vaginal bleeding, contractions, leaking of fluid and fetal movement were reviewed in detail with the patient.  All questions were answered. Has home bp cuff. Rx faxed to . Check bp weekly, let us know if >140/90.   Follow-up: Return in about 1 week (around 12/14/2018) for [place].  Orders Placed This Encounter  Procedures  . Flu Vaccine QUAD 36+ mos IM  . POC Urinalysis Dipstick OB   Mertie Clause Gerturde Kuba  12/07/2018 9:45 AM

## 2018-12-08 ENCOUNTER — Encounter: Payer: PRIVATE HEALTH INSURANCE | Admitting: Obstetrics and Gynecology

## 2018-12-14 ENCOUNTER — Encounter: Payer: PRIVATE HEALTH INSURANCE | Admitting: Women's Health

## 2018-12-15 ENCOUNTER — Encounter: Payer: PRIVATE HEALTH INSURANCE | Admitting: Advanced Practice Midwife

## 2018-12-20 ENCOUNTER — Other Ambulatory Visit: Payer: Self-pay

## 2018-12-20 ENCOUNTER — Encounter (HOSPITAL_COMMUNITY): Payer: Self-pay

## 2018-12-20 ENCOUNTER — Inpatient Hospital Stay (EMERGENCY_DEPARTMENT_HOSPITAL)
Admission: AD | Admit: 2018-12-20 | Discharge: 2018-12-20 | Disposition: A | Discharge status: Home / Self Care | Payer: PRIVATE HEALTH INSURANCE | Source: Home / Self Care | Attending: Family Medicine | Admitting: Family Medicine

## 2018-12-20 DIAGNOSIS — Z0371 Encounter for suspected problem with amniotic cavity and membrane ruled out: Secondary | ICD-10-CM

## 2018-12-20 DIAGNOSIS — O26893 Other specified pregnancy related conditions, third trimester: Secondary | ICD-10-CM | POA: Insufficient documentation

## 2018-12-20 DIAGNOSIS — Z3A40 40 weeks gestation of pregnancy: Secondary | ICD-10-CM

## 2018-12-20 LAB — POCT FERN TEST: POCT Fern Test: NEGATIVE

## 2018-12-20 NOTE — MAU Note (Cosign Needed)
Chief Complaint:  Abdominal Pain and Rupture of Membranes   First Provider Initiated Contact with Patient 12/20/18 1739     HPI  HPI: Alyssa Hodge is a 38 y.o. G1P0 at [redacted]w[redacted]d who presents to maternity admissions reporting fluid leakage described as thick mucous/gel. Intermittent menstrual like cramps qhr. She reports good fetal movement, vaginal bleeding, vaginal itching/burning, urinary symptoms, h/a, dizziness, n/v, diarrhea, constipation or fever/chills.  She denies headache, visual changes or RUQ abdominal pain.   Past Medical History: Past Medical History:  Diagnosis Date  . Medical history non-contributory     Past obstetric history: OB History  Gravida Para Term Preterm AB Living  1            SAB TAB Ectopic Multiple Live Births               # Outcome Date GA Lbr Len/2nd Weight Sex Delivery Anes PTL Lv  1 Current             Past Surgical History: Past Surgical History:  Procedure Laterality Date  . NO PAST SURGERIES      Family History: Family History  Problem Relation Age of Onset  . Cancer Paternal Grandfather   . Hypertension Maternal Grandmother   . Heart disease Maternal Grandfather   . Hypertension Father   . Hypertension Mother   . Heart disease Mother     Social History: Social History   Tobacco Use  . Smoking status: Never Smoker  . Smokeless tobacco: Never Used  Substance Use Topics  . Alcohol use: Never    Frequency: Never  . Drug use: Never    Allergies: No Known Allergies  Meds:  No medications prior to admission.    I have reviewed patient's Past Medical Hx, Surgical Hx, Family Hx, Social Hx, medications and allergies.   ROS:  Review of Systems  Constitutional: Negative.   HENT: Negative.   Eyes: Negative.   Respiratory: Negative.   Cardiovascular: Negative.   Gastrointestinal: Negative.   Endocrine: Negative.   Genitourinary: Positive for vaginal discharge ("leaked clear fluid @ 0600 and thick, jelly discharge").   Musculoskeletal: Negative.   Skin: Negative.   Allergic/Immunologic: Negative.   Neurological: Negative.   Hematological: Negative.   Psychiatric/Behavioral: Negative.    Other systems negative  Physical Exam   Patient Vitals for the past 24 hrs:  BP Temp Temp src Pulse Resp SpO2 Weight  12/20/18 1846 127/86 - - - - - -  12/20/18 1708 127/84 98.7 F (37.1 C) Oral (!) 106 16 99 % 67.7 kg   Constitutional: Well-developed, well-nourished female in no acute distress.  Cardiovascular: normal rate and rhythm Respiratory: normal effort, clear to auscultation bilaterally GI: Abd soft, non-tender, gravid appropriate for gestational age.   No rebound or guarding. MS: Extremities nontender, no edema, normal ROM Neurologic: Alert and oriented x 4.  GU: Neg CVAT.  PELVIC EXAM: Cervix pink, visually closed, without lesion, scant white creamy discharge, vaginal walls and external genitalia normal  Dilation: 1 Effacement (%): 50 Cervical Position: Middle Station: -1 Presentation: Vertex Exam by:: B. Bowen, RN   FHT:  Baseline 135 , moderate variability, accelerations present, no decelerations Contractions: irregular q 4-6 mins    Labs: Results for orders placed or performed during the hospital encounter of 12/20/18 (from the past 24 hour(s))  POCT fern test     Status: None   Collection Time: 12/20/18  6:56 PM  Result Value Ref Range   POCT Fern Test Negative =  intact amniotic membranes    A/Positive/-- (04/14 1348)   MAU Course/MDM: Patient is good to discharge  Assessment: 1. No leakage of amniotic fluid into vagina     Plan: Discharge home Labor precautions and fetal kick counts Follow up in Office for prenatal visits and recheck  Follow-up [place]. Go on 12/21/2018.   Why: At 11:30 AM as scheduled Contact information: [place] [id number] [phone]         Pt stable at time of discharge.  Sherrian Divers MS3 12/20/2018 7:06 PM  I confirm that I have verified the information documented in the medical student's note and that I have also personally reperformed the history, physical exam and all medical decision making activities of this service and have verified that all service and findings are accurately documented in this student's note.   Note copied from Medical Student's note d/t his note not being properly co-signed to me.  Laury Deep, CNM 12/20/2018 9:10 PM

## 2018-12-20 NOTE — MAU Note (Signed)
Had small amt of clear fluid come out around 0600, watery, none since.  Did have some thick mucous/gel come out. No bleeding. has had some menstrual like cramps, off and on - sometimes an hour apart.Marland Kitchen

## 2018-12-21 ENCOUNTER — Ambulatory Visit (INDEPENDENT_AMBULATORY_CARE_PROVIDER_SITE_OTHER): Payer: PRIVATE HEALTH INSURANCE | Admitting: Women's Health

## 2018-12-21 ENCOUNTER — Encounter (HOSPITAL_COMMUNITY): Payer: Self-pay

## 2018-12-21 ENCOUNTER — Other Ambulatory Visit: Payer: Self-pay

## 2018-12-21 ENCOUNTER — Telehealth (HOSPITAL_COMMUNITY): Payer: Self-pay | Admitting: *Deleted

## 2018-12-21 ENCOUNTER — Encounter: Payer: Self-pay | Admitting: Women's Health

## 2018-12-21 ENCOUNTER — Inpatient Hospital Stay (HOSPITAL_COMMUNITY): Payer: PRIVATE HEALTH INSURANCE | Admitting: Anesthesiology

## 2018-12-21 ENCOUNTER — Inpatient Hospital Stay (HOSPITAL_COMMUNITY)
Admission: AD | Admit: 2018-12-21 | Discharge: 2018-12-24 | DRG: 807 | Disposition: A | Discharge status: Home / Self Care | Payer: PRIVATE HEALTH INSURANCE | Attending: Obstetrics & Gynecology | Admitting: Obstetrics & Gynecology

## 2018-12-21 VITALS — BP 115/83 | HR 92 | Wt 148.0 lb

## 2018-12-21 DIAGNOSIS — Z3A40 40 weeks gestation of pregnancy: Secondary | ICD-10-CM

## 2018-12-21 DIAGNOSIS — Z3403 Encounter for supervision of normal first pregnancy, third trimester: Secondary | ICD-10-CM

## 2018-12-21 DIAGNOSIS — Z20828 Contact with and (suspected) exposure to other viral communicable diseases: Secondary | ICD-10-CM | POA: Diagnosis present

## 2018-12-21 DIAGNOSIS — R030 Elevated blood-pressure reading, without diagnosis of hypertension: Secondary | ICD-10-CM | POA: Diagnosis present

## 2018-12-21 DIAGNOSIS — O480 Post-term pregnancy: Secondary | ICD-10-CM | POA: Diagnosis present

## 2018-12-21 DIAGNOSIS — O134 Gestational [pregnancy-induced] hypertension without significant proteinuria, complicating childbirth: Secondary | ICD-10-CM | POA: Diagnosis present

## 2018-12-21 DIAGNOSIS — Z331 Pregnant state, incidental: Secondary | ICD-10-CM

## 2018-12-21 DIAGNOSIS — Z1389 Encounter for screening for other disorder: Secondary | ICD-10-CM

## 2018-12-21 LAB — COMPREHENSIVE METABOLIC PANEL
ALT: 19 U/L (ref 0–44)
AST: 28 U/L (ref 15–41)
Albumin: 3.1 g/dL — ABNORMAL LOW (ref 3.5–5.0)
Alkaline Phosphatase: 193 U/L — ABNORMAL HIGH (ref 38–126)
Anion gap: 12 (ref 5–15)
BUN: 7 mg/dL (ref 6–20)
CO2: 19 mmol/L — ABNORMAL LOW (ref 22–32)
Calcium: 9.5 mg/dL (ref 8.9–10.3)
Chloride: 104 mmol/L (ref 98–111)
Creatinine, Ser: 0.69 mg/dL (ref 0.44–1.00)
GFR calc Af Amer: >60 mL/min (ref >60)
GFR calc non Af Amer: >60 mL/min (ref >60)
Glucose, Bld: 84 mg/dL (ref 70–99)
Potassium: 3.7 mmol/L (ref 3.5–5.1)
Sodium: 135 mmol/L (ref 135–145)
Total Bilirubin: 0.4 mg/dL (ref 0.3–1.2)
Total Protein: 7.2 g/dL (ref 6.5–8.1)

## 2018-12-21 LAB — POCT URINALYSIS DIPSTICK OB
Blood, UA: 3
Glucose, UA: NEGATIVE
Ketones, UA: NEGATIVE
Leukocytes, UA: NEGATIVE
Nitrite, UA: NEGATIVE
POC,PROTEIN,UA: NEGATIVE

## 2018-12-21 LAB — CBC
HCT: 41.5 % (ref 36.0–46.0)
Hemoglobin: 13.7 g/dL (ref 12.0–15.0)
MCH: 27.7 pg (ref 26.0–34.0)
MCHC: 33 g/dL (ref 30.0–36.0)
MCV: 83.8 fL (ref 80.0–100.0)
Platelets: 322 10*3/uL (ref 150–400)
RBC: 4.95 MIL/uL (ref 3.87–5.11)
RDW: 15.9 % — ABNORMAL HIGH (ref 11.5–15.5)
WBC: 14.1 10*3/uL — ABNORMAL HIGH (ref 4.0–10.5)
nRBC: 0 % (ref 0.0–0.2)

## 2018-12-21 LAB — PROTEIN / CREATININE RATIO, URINE
Creatinine, Urine: 30.99 mg/dL
Total Protein, Urine: <6 mg/dL

## 2018-12-21 LAB — ABO/RH: ABO/RH(D): A POS

## 2018-12-21 LAB — TYPE AND SCREEN
ABO/RH(D): A POS
Antibody Screen: NEGATIVE

## 2018-12-21 LAB — SARS CORONAVIRUS 2 BY RT PCR (HOSPITAL ORDER, PERFORMED IN ~~LOC~~ HOSPITAL LAB): SARS Coronavirus 2: NEGATIVE

## 2018-12-21 MED ORDER — TERBUTALINE SULFATE 1 MG/ML IJ SOLN: 0.2500 mg | Freq: Once | INTRAMUSCULAR | Status: DC | PRN

## 2018-12-21 MED ORDER — OXYCODONE-ACETAMINOPHEN 5-325 MG PO TABS: 1.0000 | ORAL_TABLET | ORAL | Status: DC | PRN

## 2018-12-21 MED ORDER — FENTANYL-BUPIVACAINE-NACL 0.5-0.125-0.9 MG/250ML-% EP SOLN
12.0000 mL/h | EPIDURAL | Status: DC | PRN
  Administered 2018-12-22: 13:00:00 12 mL/h via EPIDURAL
  Filled 2018-12-21 (×2): qty 250

## 2018-12-21 MED ORDER — FENTANYL CITRATE (PF) 100 MCG/2ML IJ SOLN
50.0000 ug | INTRAMUSCULAR | Status: DC | PRN
  Administered 2018-12-21: 20:00:00 100 ug via INTRAVENOUS
  Filled 2018-12-21: qty 2

## 2018-12-21 MED ORDER — OXYTOCIN BOLUS FROM INFUSION
500.0000 mL | Freq: Once | INTRAVENOUS | Status: AC
  Administered 2018-12-22: 500 mL via INTRAVENOUS

## 2018-12-21 MED ORDER — OXYTOCIN 40 UNITS IN NORMAL SALINE INFUSION - SIMPLE MED
2.5000 [IU]/h | INTRAVENOUS | Status: DC
  Filled 2018-12-21: qty 1000

## 2018-12-21 MED ORDER — LACTATED RINGERS IV SOLN
INTRAVENOUS | Status: DC
  Administered 2018-12-21 – 2018-12-22 (×3): via INTRAVENOUS

## 2018-12-21 MED ORDER — LACTATED RINGERS IV SOLN
500.0000 mL | Freq: Once | INTRAVENOUS | Status: AC
  Administered 2018-12-21: 22:00:00 500 mL via INTRAVENOUS

## 2018-12-21 MED ORDER — LIDOCAINE HCL (PF) 1 % IJ SOLN
INTRAMUSCULAR | Status: DC | PRN
  Administered 2018-12-21 (×2): 4 mL via EPIDURAL

## 2018-12-21 MED ORDER — SODIUM CHLORIDE (PF) 0.9 % IJ SOLN
INTRAMUSCULAR | Status: DC | PRN
  Administered 2018-12-21: 12 mL/h via EPIDURAL

## 2018-12-21 MED ORDER — LACTATED RINGERS IV SOLN: 500.0000 mL | Freq: Once | INTRAVENOUS | Status: DC

## 2018-12-21 MED ORDER — SOD CITRATE-CITRIC ACID 500-334 MG/5ML PO SOLN: 30.0000 mL | ORAL | Status: DC | PRN

## 2018-12-21 MED ORDER — EPHEDRINE 5 MG/ML INJ: 10.0000 mg | INTRAVENOUS | Status: DC | PRN

## 2018-12-21 MED ORDER — PHENYLEPHRINE 40 MCG/ML (10ML) SYRINGE FOR IV PUSH (FOR BLOOD PRESSURE SUPPORT): 80.0000 ug | PREFILLED_SYRINGE | INTRAVENOUS | Status: DC | PRN

## 2018-12-21 MED ORDER — ACETAMINOPHEN 325 MG PO TABS: 650.0000 mg | ORAL_TABLET | ORAL | Status: DC | PRN

## 2018-12-21 MED ORDER — ONDANSETRON HCL 4 MG/2ML IJ SOLN
4.0000 mg | Freq: Four times a day (QID) | INTRAMUSCULAR | Status: DC | PRN
  Administered 2018-12-21: 20:00:00 4 mg via INTRAVENOUS
  Filled 2018-12-21: qty 2

## 2018-12-21 MED ORDER — LIDOCAINE HCL (PF) 1 % IJ SOLN: 30.0000 mL | INTRAMUSCULAR | Status: DC | PRN

## 2018-12-21 MED ORDER — OXYCODONE-ACETAMINOPHEN 5-325 MG PO TABS: 2.0000 | ORAL_TABLET | ORAL | Status: DC | PRN

## 2018-12-21 MED ORDER — MISOPROSTOL 25 MCG QUARTER TABLET
25.0000 ug | ORAL_TABLET | ORAL | Status: DC | PRN
  Filled 2018-12-21: qty 1

## 2018-12-21 MED ORDER — LACTATED RINGERS IV SOLN: 500.0000 mL | INTRAVENOUS | Status: DC | PRN

## 2018-12-21 MED ORDER — FLEET ENEMA 7-19 GM/118ML RE ENEM: 1.0000 | ENEMA | RECTAL | Status: DC | PRN

## 2018-12-21 MED ORDER — DIPHENHYDRAMINE HCL 50 MG/ML IJ SOLN: 12.5000 mg | INTRAMUSCULAR | Status: DC | PRN

## 2018-12-21 MED ORDER — HYDROXYZINE HCL 50 MG PO TABS
50.0000 mg | ORAL_TABLET | Freq: Four times a day (QID) | ORAL | Status: DC | PRN
  Filled 2018-12-21: qty 1

## 2018-12-21 NOTE — Patient Instructions (Addendum)
Celise Donoghue, I greatly value your feedback.  If you receive a survey following your visit with [place] today, we appreciate you taking the time to fill it out.  Thanks, [name], CNM, WHNP-BC  Your induction is scheduled for 10/23 @ 11:45pm. Go to the main desk at [facility] hospital and let them know you are there to be induced. They will send someone from Labor & Delivery to come get you.    [facility] HAS MOVED!!! It is now [facility] ([address] [place], [place] [address]) Entrance located off of [address] Free 24/7 valet parking   Go to [email or web address] to register for FREE online childbirth classes    Call the office [phone]) or go to [facility] if:  You begin to have strong, frequent contractions  Your water breaks.  Sometimes it is a big gush of fluid, sometimes it is just a trickle that keeps getting your panties wet or running down your legs  You have vaginal bleeding.  It is normal to have a small amount of spotting if your cervix was checked.   You don't feel your baby moving like normal.  If you don't, get you something to eat and drink and lay down and focus on feeling your baby move.  You should feel at least 10 movements in 2 hours.  If you don't, you should call the office or go to [facility].   Home Blood Pressure Monitoring for Patients   Your provider has recommended that you check your blood pressure (BP) at least once a week at home. If you do not have a blood pressure cuff at home, one will be provided for you. Contact your provider if you have not received your monitor within 1 week.   Helpful Tips for Accurate Home Blood Pressure Checks   Don't smoke, exercise, or drink caffeine 30 minutes before checking your BP  Use the restroom before checking your BP (a full bladder can raise your pressure)  Relax in a comfortable upright chair  Feet on the ground  Left arm resting comfortably on a flat surface at  the level of your heart  Legs uncrossed  Back supported  Sit quietly and don't talk  Place the cuff on your bare arm  Adjust snuggly, so that only two fingertips can fit between your skin and the top of the cuff  Check 2 readings separated by at least one minute  Keep a log of your BP readings  For a visual, please reference this diagram: http://ccnc.care/bpdiagram  Provider Name: [facility]     Phone: [phone]  Zone 1: ALL CLEAR  Continue to monitor your symptoms:   BP reading is less than 140 (top number) or less than 90 (bottom number)   No right upper stomach pain  No headaches or seeing spots  No feeling nauseated or throwing up  No swelling in face and hands  Zone 2: CAUTION Call your doctor's office for any of the following:   BP reading is greater than 140 (top number) or greater than 90 (bottom number)   Stomach pain under your ribs in the middle or right side  Headaches or seeing spots  Feeling nauseated or throwing up  Swelling in face and hands  Zone 3: EMERGENCY  Seek immediate medical care if you have any of the following:   BP reading is greater than160 (top number) or greater than 110 (bottom number)  Severe headaches not  improving with Tylenol  Serious difficulty catching your breath  Any worsening symptoms from Zone 2    Braxton Hicks Contractions Contractions of the uterus can occur throughout pregnancy, but they are not always a sign that you are in labor. You may have practice contractions called Braxton Hicks contractions. These false labor contractions are sometimes confused with true labor. What are [name] contractions? Braxton Hicks contractions are tightening movements that occur in the muscles of the uterus before labor. Unlike true labor contractions, these contractions do not result in opening (dilation) and thinning of the cervix. Toward the end of pregnancy (32-34 weeks), Braxton Hicks contractions can  happen more often and may become stronger. These contractions are sometimes difficult to tell apart from true labor because they can be very uncomfortable. You should not feel embarrassed if you go to the hospital with false labor. Sometimes, the only way to tell if you are in true labor is for your health care provider to look for changes in the cervix. The health care provider will do a physical exam and may monitor your contractions. If you are not in true labor, the exam should show that your cervix is not dilating and your water has not broken. If there are no other health problems associated with your pregnancy, it is completely safe for you to be sent home with false labor. You may continue to have Braxton Hicks contractions until you go into true labor. How to tell the difference between true labor and false labor True labor  Contractions last 30-70 seconds.  Contractions become very regular.  Discomfort is usually felt in the top of the uterus, and it spreads to the lower abdomen and low back.  Contractions do not go away with walking.  Contractions usually become more intense and increase in frequency.  The cervix dilates and gets thinner. False labor  Contractions are usually shorter and not as strong as true labor contractions.  Contractions are usually irregular.  Contractions are often felt in the front of the lower abdomen and in the groin.  Contractions may go away when you walk around or change positions while lying down.  Contractions get weaker and are shorter-lasting as time goes on.  The cervix usually does not dilate or become thin. Follow these instructions at home:   Take over-the-counter and prescription medicines only as told by your health care provider.  Keep up with your usual exercises and follow other instructions from your health care provider.  Eat and drink lightly if you think you are going into labor.  If Braxton Hicks contractions are making  you uncomfortable: ? Change your position from lying down or resting to walking, or change from walking to resting. ? Sit and rest in a tub of warm water. ? Drink enough fluid to keep your urine pale yellow. Dehydration may cause these contractions. ? Do slow and deep breathing several times an hour.  Keep all follow-up prenatal visits as told by your health care provider. This is important. Contact a health care provider if:  You have a fever.  You have continuous pain in your abdomen. Get help right away if:  Your contractions become stronger, more regular, and closer together.  You have fluid leaking or gushing from your vagina.  You pass blood-tinged mucus (bloody show).  You have bleeding from your vagina.  You have low back pain that you never had before.  You feel your babys head pushing down and causing pelvic pressure.  Your  baby is not moving inside you as much as it used to. Summary  Contractions that occur before labor are called Braxton Hicks contractions, false labor, or practice contractions.  Braxton Hicks contractions are usually shorter, weaker, farther apart, and less regular than true labor contractions. True labor contractions usually become progressively stronger and regular, and they become more frequent.  Manage discomfort from [facility] contractions by changing position, resting in a warm bath, drinking plenty of water, or practicing deep breathing. This information is not intended to replace advice given to you by your health care provider. Make sure you discuss any questions you have with your health care provider. Document Released: 07/03/2016 Document Revised: 01/30/2017 Document Reviewed: 07/03/2016 Elsevier Patient Education  2020 [employer]

## 2018-12-21 NOTE — MAU Note (Signed)
Pt states that she started having ctx at 7 am.    Pt reports seeing a little bit of blood when wiping.    Reports +FM

## 2018-12-21 NOTE — Progress Notes (Signed)
LOW-RISK PREGNANCY VISIT Patient name: Zamara Cozad MRN [id number]  Date of birth: [birth year] Chief Complaint:   Routine Prenatal Visit (room # 8)  History of Present Illness:   Nala Kachel is a 38 y.o. G1P0 female at [redacted]w[redacted]d with an Estimated Date of Delivery: 12/18/18 being seen today for ongoing management of a low-risk pregnancy.  Today she reports contractions, worse since around 0700. Went to [facility]U yesterday for r/o SROM, was neg. Some spotting w/ wiping.  Contractions: Regular. Vag. Bleeding: Scant.  Movement: Present. denies current leaking of fluid. Review of Systems:   Pertinent items are noted in HPI Denies abnormal vaginal discharge w/ itching/odor/irritation, headaches, visual changes, shortness of breath, chest pain, abdominal pain, severe nausea/vomiting, or problems with urination or bowel movements unless otherwise stated above. Pertinent History Reviewed:  Reviewed past medical,surgical, social, obstetrical and family history.  Reviewed problem list, medications and allergies. Physical Assessment:   Vitals:   12/21/18 1135  BP: 115/83  Pulse: 92  Weight: 148 lb (67.1 kg)  Body mass index is 23.18 kg/m.        Physical Examination:   General appearance: Well appearing, and in no distress  Mental status: Alert, oriented to person, place, and time  Skin: Warm & dry  Cardiovascular: Normal heart rate noted  Respiratory: Normal respiratory effort, no distress  Abdomen: Soft, gravid, nontender  Pelvic: Cervical exam performed  Dilation: 2.5 Effacement (%): 50 Station: -2 Offered membrane sweeping, discussed r/b- pt decided to proceed, so membranes swept.   Extremities: Edema: None  Fetal Status: Fetal Heart Rate (bpm): 135   Movement: Present Presentation: Vertex  NST: FHR baseline 135 bpm, Variability: moderate, Accelerations:present, Decelerations:  Absent= Cat 1/Reactive Toco: regular, every 1-3 minutes    Results for orders placed or performed in visit on 12/21/18  (from the past 24 hour(s))  POC Urinalysis Dipstick OB   Collection Time: 12/21/18 11:46 AM  Result Value Ref Range   Color, UA     Clarity, UA     Glucose, UA Negative Negative   Bilirubin, UA     Ketones, UA NEG    Spec Grav, UA     Blood, UA 3    pH, UA     POC,PROTEIN,UA Negative Negative, Trace, Small (1+), Moderate (2+), Large (3+), 4+   Urobilinogen, UA     Nitrite, UA NEG    Leukocytes, UA Negative Negative   Appearance     Odor    Results for orders placed or performed during the hospital encounter of 12/20/18 (from the past 24 hour(s))  POCT fern test   Collection Time: 12/20/18  6:56 PM  Result Value Ref Range   POCT Fern Test Negative = intact amniotic membranes     Assessment & Plan:  1) Low-risk pregnancy G1P0 at [redacted]w[redacted]d with an Estimated Date of Delivery: 12/18/18   2) Postdates, reactive NST, membranes swept, IOL scheduled for 10/24 @ [facility] if needed   Meds: No orders of the defined types were placed in this encounter.  Labs/procedures today: nst, sve, membrane sweep  Plan:  IOL scheduled,  IOL form faxed via Epic and orders placed    Reviewed: Term labor symptoms and general obstetric precautions including but not limited to vaginal bleeding, contractions, leaking of fluid and fetal movement were reviewed in detail with the patient.  All questions were answered.  Follow-up: No follow-ups on file.  Orders Placed This Encounter  Procedures  . POC Urinalysis Dipstick OB   Merlene Laughter  Gaspar Garbe, [facility] 12/21/2018 12:39 PM

## 2018-12-21 NOTE — Anesthesia Preprocedure Evaluation (Signed)
Anesthesia Evaluation  Patient identified by MRN, date of birth, ID band Patient awake    Reviewed: Allergy & Precautions, Patient's Chart, lab work & pertinent test results  Airway Mallampati: II  TM Distance: >3 FB Neck ROM: Full    Dental  (+) Teeth Intact   Pulmonary neg pulmonary ROS,    Pulmonary exam normal breath sounds clear to auscultation       Cardiovascular hypertension, Normal cardiovascular exam Rhythm:Regular Rate:Normal     Neuro/Psych negative neurological ROS  negative psych ROS   GI/Hepatic negative GI ROS, Neg liver ROS,   Endo/Other  negative endocrine ROS  Renal/GU negative Renal ROS  negative genitourinary   Musculoskeletal negative musculoskeletal ROS (+)   Abdominal   Peds  Hematology negative hematology ROS (+)   Anesthesia Other Findings   Reproductive/Obstetrics (+) Pregnancy                             Anesthesia Physical Anesthesia Plan  ASA: II  Anesthesia Plan: Epidural   Post-op Pain Management:    Induction:   PONV Risk Score and Plan:   Airway Management Planned: Natural Airway  Additional Equipment:   Intra-op Plan:   Post-operative Plan:   Informed Consent: I have reviewed the patients History and Physical, chart, labs and discussed the procedure including the risks, benefits and alternatives for the proposed anesthesia with the patient or authorized representative who has indicated his/her understanding and acceptance.       Plan Discussed with: Anesthesiologist  Anesthesia Plan Comments:         Anesthesia Quick Evaluation

## 2018-12-21 NOTE — H&P (Addendum)
LABOR AND DELIVERY ADMISSION HISTORY AND PHYSICAL NOTE  Alyssa Hodge is a 38 y.o. female G40P0 with IUP at [redacted]w[redacted]d by 1TMUS presenting for SOL and new for new onset l hypertension. Pregnancy complicated by LSIL.   Contractions since 0700 this a.m. Was seen in office today and MAU yesterday for r/o of SROM. U/A in office was WNL. Membranes were swept. She reports positive fetal movement. She denies leakage of fluid or vaginal bleeding. Denies HA or vision changes.   Prenatal History/Complications: Gestational Hypertension 10/20 LSIL/HPV 5/19   Past Medical History: Past Medical History:  Diagnosis Date  . Medical history non-contributory     Past Surgical History: Past Surgical History:  Procedure Laterality Date  . NO PAST SURGERIES      Obstetrical History: OB History    Gravida  1   Para      Term      Preterm      AB      Living        SAB      TAB      Ectopic      Multiple      Live Births              Social History: Social History   Socioeconomic History  . Marital status: Married    Spouse name: Greig Castilla  . Number of children: Not on file  . Years of education: Not on file  . Highest education level: Not on file  Occupational History  . Not on file  Social Needs  . Financial resource strain: Not hard at all  . Food insecurity    Worry: Never true    Inability: Never true  . Transportation needs    Medical: No    Non-medical: No  Tobacco Use  . Smoking status: Never Smoker  . Smokeless tobacco: Never Used  Substance and Sexual Activity  . Alcohol use: Never    Frequency: Never  . Drug use: Never  . Sexual activity: Yes    Birth control/protection: None  Lifestyle  . Physical activity    Days per week: 0 days    Minutes per session: 0 min  . Stress: Not at all  Relationships  . Social Herbalist on phone: Three times a week    Gets together: Three times a week    Attends religious service: Never    Active member  of club or organization: No    Attends meetings of clubs or organizations: Never    Relationship status: Not on file  Other Topics Concern  . Not on file  Social History Narrative  . Not on file    Family History: Family History  Problem Relation Age of Onset  . Cancer Paternal Grandfather   . Hypertension Maternal Grandmother   . Heart disease Maternal Grandfather   . Hypertension Father   . Hypertension Mother   . Heart disease Mother     Allergies: No Known Allergies  Medications Prior to Admission  Medication Sig Dispense Refill Last Dose  . Acetaminophen (TYLENOL PO) Take by mouth as needed.   Past Week at Unknown time  . Cholecalciferol (VITAMIN D3) 20 MCG (800 UNIT) TABS Take by mouth.   Past Week at Unknown time  . Doxylamine Succinate, Sleep, (UNISOM PO) Take by mouth as needed.   12/20/2018 at Unknown time  . Prenatal Vit-Fe Fumarate-FA (PRENATAL VITAMIN PO) Take by mouth.   12/21/2018 at Unknown time  .  Blood Pressure Monitor MISC For regular home bp monitoring during pregnancy (Patient not taking: Reported on 10/05/2018) 1 each 0   . promethazine (PHENERGAN) 25 MG tablet Take 0.5-1 tablets (12.5-25 mg total) by mouth every 6 (six) hours as needed for nausea or vomiting. (Patient not taking: Reported on 11/23/2018) 30 tablet 0      Review of Systems   All systems reviewed and negative except as stated in HPI  Blood pressure (!) 144/87, pulse 83, temperature 98.5 F (36.9 C), resp. rate 20, height 5' 7.72" (1.72 m), weight 67.1 kg, last menstrual period 02/26/2018, SpO2 100 %. General appearance: alert Lungs: clear to auscultation bilaterally Heart: regular rate and rhythm Abdomen: soft, non-tender; bowel sounds normal Extremities: No calf swelling or tenderness Presentation: cephalic vertex Fetal monitoring: Baseline 140, variability moderate, accelerations present, no decelerations Uterine activity: q 2 min  Dilation: 2.5 Effacement (%): 60 Station: -2 Exam  by:: Mary [place] Johnson, RN    Prenatal labs: ABO, Rh: --/--/A POS, A POS Performed at [facility], [address] [place], [place] [address]  [phone]/20 1735) Antibody: NEG (10/20 1735) Rubella: 20.00 (04/14 1348) RPR: Non Reactive (08/04 0918)  HBsAg: Negative (04/14 1348)  HIV: Non Reactive (08/04 0918)  GBS: Negative/-- (09/22 1040)  1 hr Glucola: WNL  Genetic screening:  Declined  Anatomy US: 9/23- WNL. Left lateral placenta. EFW 2865 g 43 %  Prenatal Transfer Tool  Maternal Diabetes: No Genetic Screening: Declined Maternal Ultrasounds/Referrals: Normal Fetal Ultrasounds or other Referrals:  None Maternal Substance Abuse:  No Significant Maternal Medications:  None Significant Maternal Lab Results: Group B Strep negative  Results for orders placed or performed during the hospital encounter of 12/21/18 (from the past 24 hour(s))  SARS Coronavirus 2 by RT PCR (hospital order, performed in [facility] hospital lab) Nasopharyngeal Nasopharyngeal Swab   Collection Time: 12/21/18  5:20 PM   Specimen: Nasopharyngeal Swab  Result Value Ref Range   SARS Coronavirus 2 NEGATIVE NEGATIVE  Comprehensive metabolic panel   Collection Time: 12/21/18  5:35 PM  Result Value Ref Range   Sodium 135 135 - 145 mmol/L   Potassium 3.7 3.5 - 5.1 mmol/L   Chloride 104 98 - 111 mmol/L   CO2 19 (L) 22 - 32 mmol/L   Glucose, Bld 84 70 - 99 mg/dL   BUN 7 6 - 20 mg/dL   Creatinine, Ser 7.86 0.44 - 1.00 mg/dL   Calcium 9.5 8.9 - 76.7 mg/dL   Total Protein 7.2 6.5 - 8.1 g/dL   Albumin 3.1 (L) 3.5 - 5.0 g/dL   AST 28 15 - 41 U/L   ALT 19 0 - 44 U/L   Alkaline Phosphatase 193 (H) 38 - 126 U/L   Total Bilirubin 0.4 0.3 - 1.2 mg/dL   GFR calc non Af Amer >60 >60 mL/min   GFR calc Af Amer >60 >60 mL/min   Anion gap 12 5 - 15  Protein / creatinine ratio, urine   Collection Time: 12/21/18  5:35 PM  Result Value Ref Range   Creatinine, Urine 30.99 mg/dL   Total Protein, Urine <6 mg/dL    Protein Creatinine Ratio        0.00 - 0.15 mg/mg[Cre]  CBC   Collection Time: 12/21/18  5:35 PM  Result Value Ref Range   WBC 14.1 (H) 4.0 - 10.5 K/uL   RBC 4.95 3.87 - 5.11 MIL/uL   Hemoglobin 13.7 12.0 - 15.0 g/dL   HCT 20.9 47.0 -  46.0 %   MCV 83.8 80.0 - 100.0 fL   MCH 27.7 26.0 - 34.0 pg   MCHC 33.0 30.0 - 36.0 g/dL   RDW 16.115.9 (H) 09.611.5 - 04.515.5 %   Platelets 322 150 - 400 K/uL   nRBC 0.0 0.0 - 0.2 %  Type and screen   Collection Time: 12/21/18  5:35 PM  Result Value Ref Range   ABO/RH(D) A POS    Antibody Screen NEG    Sample Expiration      12/24/2018,2359 Performed at [facility], [address] [place], [place] [address]   ABO/Rh   Collection Time: 12/21/18  5:35 PM  Result Value Ref Range   ABO/RH(D)      A POS Performed at [facility], [address] [place], [place] [address]   Results for orders placed or performed in visit on 12/21/18 (from the past 24 hour(s))  POC Urinalysis Dipstick OB   Collection Time: 12/21/18 11:46 AM  Result Value Ref Range   Color, UA     Clarity, UA     Glucose, UA Negative Negative   Bilirubin, UA     Ketones, UA NEG    Spec Grav, UA     Blood, UA 3    pH, UA     POC,PROTEIN,UA Negative Negative, Trace, Small (1+), Moderate (2+), Large (3+), 4+   Urobilinogen, UA     Nitrite, UA NEG    Leukocytes, UA Negative Negative   Appearance     Odor      Patient Active Problem List   Diagnosis Date Noted  . Transient hypertension 12/21/2018  . Post-dates pregnancy 12/21/2018  . Supervision of normal first pregnancy 06/15/2018    Assessment: Alyssa Hodge is a 38 y.o. female G1P0 with IUP at 6745w3d by 1TMUS presenting for IOL for new onset gestational hypertension and LSIL   #Labor: Presents in early labor. Bishop score 6. Manage for anticipated VD. -SVE: 2/50/-2 @ 1649 previously 1.5 cm yesterday.  #Pain: IV Fentanyl 50-100 mcg prn; Epidural upon request   #FWB: Cat  #ID: GBS negative  #MOF: Breast  #MOC: POP  vs Nexplanon?  #Circ: Boy outpatient   #LSIL- Will need colposcopy PP    Alyssa Hodge, PGY-1, MD Labor and Delivery Teaching service  12/21/2018, 7:24 PM   I confirm that I have verified and agree with the information documented in the resident's note.   Thressa ShellerHeather Antino Mayabb DNP, CNM  12/21/18  7:27 PM

## 2018-12-21 NOTE — Progress Notes (Signed)
Induction Assessment Scheduling Form Fax to [facility]:  [phone]  Alyssa Hodge                                                                                   DOB:  September 08, 1980                                                            MRN:  [id number]                                                                     Phone #:   [phone] (Mobile)                 Provider:  [facility]  GP:  G1P0                                                            Estimated Date of Delivery: 12/18/18  Dating Criteria: 6wk u/s    Medical Indications for induction:  postdates Admission Date/Time:  10/24 @ [facility] Gestational age on admission:  46.0   Filed Weights   12/21/18 1135  Weight: 148 lb (67.1 kg)   HIV:  Non Reactive (08/04 0918) GBS: Negative/-- (09/22 1040)  2.5/50/-2, vtx   Method of induction(proposed):  pit   Scheduling Provider Signature:  Roma Schanz, CNM                                            Today's Date:  12/21/2018

## 2018-12-21 NOTE — Anesthesia Procedure Notes (Addendum)
Epidural Patient location during procedure: OB Start time: 12/21/2018 10:17 PM End time: 12/21/2018 10:24 PM  Staffing Anesthesiologist: Josephine Igo, MD Performed: anesthesiologist   Preanesthetic Checklist Completed: patient identified, site marked, surgical consent, pre-op evaluation, timeout performed, IV checked, risks and benefits discussed and monitors and equipment checked  Epidural Patient position: sitting Prep: site prepped and draped and DuraPrep Patient monitoring: continuous pulse ox and blood pressure Approach: midline Location: L3-L4 Injection technique: LOR air  Needle:  Needle type: Tuohy  Needle gauge: 17 G Needle length: 9 cm and 9 Needle insertion depth: 4 cm Catheter type: closed end flexible Catheter size: 19 Gauge Catheter at skin depth: 9 cm Test dose: negative and Other  Assessment Events: blood not aspirated, injection not painful, no injection resistance, negative IV test and no paresthesia  Additional Notes Patient identified. Risks and benefits discussed including failed block, incomplete  Pain control, post dural puncture headache, nerve damage, paralysis, blood pressure Changes, nausea, vomiting, reactions to medications-both toxic and allergic and post Partum back pain. All questions were answered. Patient expressed understanding and wished to proceed. Sterile technique was used throughout procedure. Epidural site was Dressed with sterile barrier dressing. No paresthesias, signs of intravascular injection Or signs of intrathecal spread were encountered.  Patient was more comfortable after the epidural was dosed. Please see RN's note for documentation of vital signs and FHR which are stable. Reason for block:procedure for pain

## 2018-12-21 NOTE — Telephone Encounter (Signed)
Preadmission screen Interpreter number [id number] 

## 2018-12-21 NOTE — Progress Notes (Signed)
Patient ID: Alyssa Hodge, female   DOB: 1980-08-30, 38 y.o.   MRN: [id number] Alyssa Hodge is a 38 y.o. G1P0 at [redacted]w[redacted]d admitted for early labor/GHTN.  Subjective: Uncomfortable w/ uc's, got IV pain meds recently  Objective: BP 130/86 (BP Location: Left Arm)   Pulse 71   Temp 97.8 F (36.6 C) (Axillary)   Resp 17   Ht 5' 7.72" (1.72 m)   Wt 67.1 kg   LMP 02/26/2018   SpO2 100%   BMI 22.68 kg/m  No intake/output data recorded.  FHT:  FHR: 125 bpm, variability: moderate,  accelerations:  Present,  decelerations:  Absent UC:   regular, every 2-5 minutes  SVE:   Dilation: 3.5 Effacement (%): 80 Station: -1 Exam by:: Knute Neu, CNM   Labs: Lab Results  Component Value Date   WBC 14.1 (H) 12/21/2018   HGB 13.7 12/21/2018   HCT 41.5 12/21/2018   MCV 83.8 12/21/2018   PLT 322 12/21/2018    Assessment / Plan: Spontaneous labor, progressing normally, offered AROM, wants to wait til husband gets back  Labor: Progressing normally Fetal Wellbeing:  Category I Pain Control:  IV pain meds Pre-eclampsia: GHTN, bp's stable, labs normal I/D:  GBS neg Anticipated MOD:  NSVD  Alyssa Hodge CNM, WHNP-BC 12/21/2018, 9:05 PM

## 2018-12-22 ENCOUNTER — Encounter (HOSPITAL_COMMUNITY): Payer: Self-pay | Admitting: *Deleted

## 2018-12-22 DIAGNOSIS — O480 Post-term pregnancy: Secondary | ICD-10-CM

## 2018-12-22 DIAGNOSIS — Z3A40 40 weeks gestation of pregnancy: Secondary | ICD-10-CM

## 2018-12-22 DIAGNOSIS — O134 Gestational [pregnancy-induced] hypertension without significant proteinuria, complicating childbirth: Secondary | ICD-10-CM

## 2018-12-22 LAB — CBC
HCT: 35.4 % — ABNORMAL LOW (ref 36.0–46.0)
Hemoglobin: 11.6 g/dL — ABNORMAL LOW (ref 12.0–15.0)
MCH: 27.4 pg (ref 26.0–34.0)
MCHC: 32.8 g/dL (ref 30.0–36.0)
MCV: 83.7 fL (ref 80.0–100.0)
Platelets: 246 10*3/uL (ref 150–400)
RBC: 4.23 MIL/uL (ref 3.87–5.11)
RDW: 16.1 % — ABNORMAL HIGH (ref 11.5–15.5)
WBC: 21.3 10*3/uL — ABNORMAL HIGH (ref 4.0–10.5)
nRBC: 0 % (ref 0.0–0.2)

## 2018-12-22 LAB — RPR: RPR Ser Ql: NONREACTIVE

## 2018-12-22 MED ORDER — DIBUCAINE (PERIANAL) 1 % EX OINT: 1.0000 "application " | TOPICAL_OINTMENT | CUTANEOUS | Status: DC | PRN

## 2018-12-22 MED ORDER — DIPHENHYDRAMINE HCL 25 MG PO CAPS: 25.0000 mg | ORAL_CAPSULE | Freq: Four times a day (QID) | ORAL | Status: DC | PRN

## 2018-12-22 MED ORDER — ONDANSETRON HCL 4 MG PO TABS: 4.0000 mg | ORAL_TABLET | ORAL | Status: DC | PRN

## 2018-12-22 MED ORDER — IBUPROFEN 600 MG PO TABS
600.0000 mg | ORAL_TABLET | Freq: Four times a day (QID) | ORAL | Status: DC
  Administered 2018-12-22 – 2018-12-24 (×8): 600 mg via ORAL
  Filled 2018-12-22 (×8): qty 1

## 2018-12-22 MED ORDER — WITCH HAZEL-GLYCERIN EX PADS: 1.0000 "application " | MEDICATED_PAD | CUTANEOUS | Status: DC | PRN

## 2018-12-22 MED ORDER — OXYTOCIN 40 UNITS IN NORMAL SALINE INFUSION - SIMPLE MED
1.0000 m[IU]/min | INTRAVENOUS | Status: DC
  Administered 2018-12-22: 03:00:00 2 m[IU]/min via INTRAVENOUS

## 2018-12-22 MED ORDER — TERBUTALINE SULFATE 1 MG/ML IJ SOLN: 0.2500 mg | Freq: Once | INTRAMUSCULAR | Status: DC | PRN

## 2018-12-22 MED ORDER — BENZOCAINE-MENTHOL 20-0.5 % EX AERO
1.0000 "application " | INHALATION_SPRAY | CUTANEOUS | Status: DC | PRN
  Administered 2018-12-22: 1 via TOPICAL
  Filled 2018-12-22: qty 56

## 2018-12-22 MED ORDER — ONDANSETRON HCL 4 MG/2ML IJ SOLN: 4.0000 mg | INTRAMUSCULAR | Status: DC | PRN

## 2018-12-22 MED ORDER — ACETAMINOPHEN 325 MG PO TABS
650.0000 mg | ORAL_TABLET | ORAL | Status: DC | PRN
  Administered 2018-12-22: 19:00:00 650 mg via ORAL
  Filled 2018-12-22: qty 2

## 2018-12-22 MED ORDER — TETANUS-DIPHTH-ACELL PERTUSSIS 5-2.5-18.5 LF-MCG/0.5 IM SUSP: 0.5000 mL | Freq: Once | INTRAMUSCULAR | Status: DC

## 2018-12-22 MED ORDER — SENNOSIDES-DOCUSATE SODIUM 8.6-50 MG PO TABS
2.0000 | ORAL_TABLET | ORAL | Status: DC
  Administered 2018-12-23 – 2018-12-24 (×2): 2 via ORAL
  Filled 2018-12-22 (×2): qty 2

## 2018-12-22 MED ORDER — COCONUT OIL OIL
1.0000 "application " | TOPICAL_OIL | Status: DC | PRN
  Administered 2018-12-24: 1 via TOPICAL

## 2018-12-22 MED ORDER — SIMETHICONE 80 MG PO CHEW: 80.0000 mg | CHEWABLE_TABLET | ORAL | Status: DC | PRN

## 2018-12-22 MED ORDER — ZOLPIDEM TARTRATE 5 MG PO TABS: 5.0000 mg | ORAL_TABLET | Freq: Every evening | ORAL | Status: DC | PRN

## 2018-12-22 MED ORDER — PRENATAL MULTIVITAMIN CH
1.0000 | ORAL_TABLET | Freq: Every day | ORAL | Status: DC
  Administered 2018-12-23 – 2018-12-24 (×2): 1 via ORAL
  Filled 2018-12-22 (×2): qty 1

## 2018-12-22 NOTE — Progress Notes (Signed)
LABOR PROGRESS NOTE  Alyssa Hodge is a 38 y.o. G1P0 at [redacted]w[redacted]d  admitted for early labor, gHTN.  Subjective: Feeling increased pressure.   Objective: BP 125/81   Pulse 83   Temp 98 F (36.7 C) (Oral)   Resp 18   Ht 5' 7.72" (1.72 m)   Wt 67.1 kg   LMP 02/26/2018   SpO2 99%   BMI 22.68 kg/m  or  Vitals:   12/22/18 0901 12/22/18 0922 12/22/18 0931 12/22/18 1001  BP: 119/69  124/88 125/81  Pulse: 87  91 83  Resp: 18  17 18   Temp:  98 F (36.7 C)    TempSrc:  Oral    SpO2:      Weight:      Height:        Dilation: 10 Dilation Complete Date: 12/22/18 Dilation Complete Time: 1117 Effacement (%): 100 Cervical Position: Middle Station: Plus 3 Presentation: Vertex Exam by:: Delorise Shiner, RN FHT: baseline rate 140, moderate varibility, + acel, + late decel when  Toco: 2-4 min   Labs: Lab Results  Component Value Date   WBC 14.1 (H) 12/21/2018   HGB 13.7 12/21/2018   HCT 41.5 12/21/2018   MCV 83.8 12/21/2018   PLT 322 12/21/2018    Patient Active Problem List   Diagnosis Date Noted  . Transient hypertension 12/21/2018  . Post-dates pregnancy 12/21/2018  . Supervision of normal first pregnancy 06/15/2018    Assessment / Plan: 38 y.o. G1P0 at [redacted]w[redacted]d here for IOL due to gHTN  Labor: Progressing well, practice pushing with nurse at this time  Fetal Wellbeing:  Cat 2  Pain Control:  epidural Anticipated MOD:  vaginal  Gifford Shave, MD  PGY-1, [facility]  12/22/2018, 12:53 PM

## 2018-12-22 NOTE — Discharge Summary (Addendum)
Postpartum Discharge Summary   Patient Name: Alyssa Hodge DOB: [birth year] MRN: [id number]  Date of admission: 12/21/2018 Delivering Provider: Lajean Manes   Date of discharge: 12/23/2018  Admitting diagnosis: CTX Intrauterine pregnancy: [redacted]w[redacted]d    Secondary diagnosis:  Active Problems:   Transient hypertension   Post-dates pregnancy   SVD (spontaneous vaginal delivery)  Additional problems: none     Discharge diagnosis: Term Pregnancy Delivered and Gestational Hypertension                                                                                                Post partum procedures:NA  Augmentation: AROM and Pitocin  Complications: None  Hospital course:  Onset of Labor With Vaginal Delivery     38y.o. yo G1P0 at 418w4das admitted in Latent Labor on 12/21/2018. Patient had an uncomplicated labor course as follows:  Membrane Rupture Time/Date: 1:57 AM ,12/22/2018   Intrapartum Procedures: Episiotomy: None [1]                                         Lacerations:  Labial [10]  Patient had a delivery of a Viable infant. 12/22/2018  Information for the patient's newborn:  AbAkeiba, Axelson0[[id number]]Delivery Method: Vag-Spont     Pateint had an uncomplicated postpartum course.  She is ambulating, tolerating a regular diet, passing flatus, and urinating well. Patient is discharged home in stable condition on 12/23/18 per her request for early d/c.  Delivery time: 1:40 PM    Magnesium Sulfate received: No BMZ received: No Rhophylac:N/A MMR:N/A Transfusion:No  Physical exam  Vitals:   12/22/18 1710 12/22/18 2110 12/23/18 0110 12/23/18 0513  BP: 118/74 110/69 127/87 105/70  Pulse: 68 78 76 75  Resp: _0 Temp: 98.1 F (36.7 C) 98.8 F (37.1 C) 98.4 F (36.9 C) 97.7 F (36.5 C)  TempSrc: Oral Oral Oral Oral  SpO2: 99%     Weight:      Height:       General: alert, cooperative and no distress Lochia: appropriate Uterine Fundus:  firm Incision: N/A DVT Evaluation: No evidence of DVT seen on physical exam. Labs: Lab Results  Component Value Date   WBC 20.2 (H) 12/23/2018   HGB 11.3 (L) 12/23/2018   HCT 33.9 (L) 12/23/2018   MCV 84.1 12/23/2018   PLT 252 12/23/2018   CMP Latest Ref Rng & Units 12/21/2018  Glucose 70 - 99 mg/dL 84  BUN 6 - 20 mg/dL 7  Creatinine 0.44 - 1.00 mg/dL 0.69  Sodium 135 - 145 mmol/L 135  Potassium 3.5 - 5.1 mmol/L 3.7  Chloride 98 - 111 mmol/L 104  CO2 22 - 32 mmol/L 19(L)  Calcium 8.9 - 10.3 mg/dL 9.5  Total Protein 6.5 - 8.1 g/dL 7.2  Total Bilirubin 0.3 - 1.2 mg/dL 0.4  Alkaline Phos 38 - 126 U/L 193(H)  AST 15 - 41 U/L 28  ALT 0 - 44 U/L 19    Discharge  instruction: per After Visit Summary and "Baby and Me Booklet".  After visit meds:  Allergies as of 12/23/2018   No Known Allergies     Medication List    STOP taking these medications   Blood Pressure Monitor Misc   promethazine 25 MG tablet Commonly known as: PHENERGAN   Vitamin D-3 25 MCG (1000 UT) Caps     TAKE these medications   acetaminophen 500 MG tablet Commonly known as: TYLENOL Take 500 mg by mouth every 6 (six) hours as needed for mild pain or headache.   benzocaine-Menthol 20-0.5 % Aero Commonly known as: DERMOPLAST Apply 1 application topically as needed for irritation (perineal discomfort).   doxylamine (Sleep) 25 MG tablet Commonly known as: UNISOM Take 25 mg by mouth at bedtime as needed for sleep.   ibuprofen 600 MG tablet Commonly known as: ADVIL Take 1 tablet (600 mg total) by mouth every 6 (six) hours.   PRENATAL VITAMIN PO Take 1 tablet by mouth daily with breakfast.       Diet: routine diet  Activity: Advance as tolerated. Pelvic rest for 6 weeks.   Outpatient follow up:4 weeks post partum, 1 week blood pressure check Follow up Appt:No future appointments. Follow up Visit: [facility].   Why: You will need a blood pressure check in one  week and a postpartum visit in 4 weeks. Contact information: [address] [place] [phone]          Please schedule this patient for Postpartum visit in: 4 weeks with the following provider: Any provider For C/S patients schedule nurse incision check in weeks 2 weeks: no Low risk pregnancy complicated by: transient HTN Delivery mode:  SVD Anticipated Birth Control:  Nexplanon or POPs PP Procedures needed: BP check  Schedule Integrated [address] visit: no   Newborn Data: Live born female  Birth Weight: 2951   APGAR: 83, 9  Newborn Delivery   Birth date/time: 12/22/2018 13:40:00 Delivery type: Vaginal, Spontaneous      Baby Feeding: Breast Disposition:home with mother  12/23/2018 Gifford Shave, MD   CNM attestation I have seen and examined this patient and agree with above documentation in the resident's note.   Alyssa Hodge is a 38 y.o. G1P1001 s/p vag del with gHTN.   Pain is well controlled.  Plan for birth control is Nexplanon vs POPs.  Method of Feeding: breast  PE:  BP 119/81 (BP Location: Right Arm)   Pulse 79   Temp 98.8 F (37.1 C) (Oral)   Resp 18   Ht 5' 7.72" (1.72 m)   Wt 67.1 kg   LMP 02/26/2018   SpO2 99%   Breastfeeding Unknown   BMI 22.68 kg/m  Fundus firm  Recent Labs    12/22/18 1442 12/23/18 0525  HGB 11.6* 11.3*  HCT 35.4* 33.9*     Plan: discharge today - postpartum care discussed - f/u clinic in 1wk BP check, then 4-6 weeks for postpartum visit   Myrtis Ser, CNM 1:21 AM

## 2018-12-22 NOTE — Progress Notes (Signed)
Patient ID: Alyssa Hodge, female   DOB: 30-Dec-1980, 38 y.o.   MRN: [id number] Alyssa Hodge is a 38 y.o. G1P0 at [redacted]w[redacted]d admitted for early labor, GHTN  Subjective: Comfortable w/ epidural  Objective: BP 112/77   Pulse 74   Temp 98.2 F (36.8 C) (Oral)   Resp 18   Ht 5' 7.72" (1.72 m)   Wt 67.1 kg   LMP 02/26/2018   SpO2 99%   BMI 22.68 kg/m  Total I/O In: -  Out: 775 [Urine:775]  FHT:  FHR: 135 bpm, variability: moderate,  accelerations:  Present,  decelerations:  Present earlies, occ variable UC:   regular, every 2-3 minutes  SVE:   Dilation: 5.5 Effacement (%): 80 Station: 0 Exam by:: (K Asharia Lotter, CNM )  Caput @ 1+ Small amt bright red bleeding w/ exam  Pitocin @ 6 mu/min  Labs: Lab Results  Component Value Date   WBC 14.1 (H) 12/21/2018   HGB 13.7 12/21/2018   HCT 41.5 12/21/2018   MCV 83.8 12/21/2018   PLT 322 12/21/2018    Assessment / Plan: Augmentation of labor, progressing well, increase pitocin per protocol as needed. Turned Lt exaggerated sims w/ peanut ball  Labor: Progressing normally Fetal Wellbeing:  Category II Pain Control:  Epidural Pre-eclampsia: bp's stable since epidural I/D:  n/a Anticipated MOD:  NSVD  Roma Schanz CNM, WHNP-BC 12/22/2018, 6:37 AM

## 2018-12-22 NOTE — Lactation Note (Signed)
This note was copied from a baby's chart. Lactation Consultation Note  Patient Name: Alyssa Hodge GURKY'H Date: 12/22/2018 Reason for consult: Follow-up assessment   RN called LC to come into room.  Mom anxious about feeding.  Infant soundly asleep STS with mom.  [place] interpreter used.  Reviewed bf basics with mom.  Reviewed hand expression.  Drops easily seen and rubbed on infants lips.  He slept and did not stir.    LC reassured mom about small stomach size and how early on infant did not need large amount of volume.   All questions answered.  Pt. Will call out for assistance if needed.     Maternal Data Formula Feeding for Exclusion: No Has patient been taught Hand Expression?: Yes Does the patient have breastfeeding experience prior to this delivery?: No  Feeding Feeding Type: Breast Fed  LATCH Score Latch: Repeated attempts needed to sustain latch, nipple held in mouth throughout feeding, stimulation needed to elicit sucking reflex.  Audible Swallowing: A few with stimulation  Type of Nipple: Everted at rest and after stimulation  Comfort (Breast/Nipple): Soft / non-tender  Hold (Positioning): Full assist, staff holds infant at breast  LATCH Score: 6  Interventions Interventions: Breast feeding basics reviewed  Lactation Tools Discussed/Used     Consult Status Consult Status: Follow-up Date: 12/23/18 Follow-up type: In-patient    Ferne Coe [facility] 12/22/2018, 10:17 PM

## 2018-12-22 NOTE — Progress Notes (Signed)
Patient ID: Alyssa Hodge, female   DOB: 10/06/1980, 38 y.o.   MRN: [id number] Alyssa Hodge is a 38 y.o. G1P0 at [redacted]w[redacted]d admitted for early labor, GHTN  Subjective: Comfortable w/ epidural, no complaints  Objective: BP 109/64   Pulse 71   Temp 98 F (36.7 C) (Oral)   Resp 17   Ht 5' 7.72" (1.72 m)   Wt 67.1 kg   LMP 02/26/2018   SpO2 99%   BMI 22.68 kg/m  No intake/output data recorded.  FHT:  FHR: 120 bpm, variability: moderate,  accelerations:  Present,  decelerations:  Absent UC:   q 2-46mins  SVE:   Dilation: 4.5 Effacement (%): 80 Station: -1 Exam by:: CNM K Adrieana Fennelly  AROM forebag, scant amt clear fluid   Labs: Lab Results  Component Value Date   WBC 14.1 (H) 12/21/2018   HGB 13.7 12/21/2018   HCT 41.5 12/21/2018   MCV 83.8 12/21/2018   PLT 322 12/21/2018    Assessment / Plan: SROM @ 2200, forebag AROM'd now, making slow progression, pitocin if needed if not continuing to change cervix  Labor: Progressing normally Fetal Wellbeing:  Category I Pain Control:  Epidural Pre-eclampsia: bp's stable I/D:  GBS neg Anticipated MOD:  NSVD  Roma Schanz CNM, WHNP-BC 12/22/2018, 2:05 AM

## 2018-12-22 NOTE — Lactation Note (Signed)
This note was copied from a baby's chart. Lactation Consultation Note  Patient Name: Boy Caley Ciaramitaro RJJOA'C Date: 12/22/2018 Reason for consult: Initial assessment;Primapara;1st time breastfeeding;Term AMA  LC in to visit briefly with P58 Mom of term baby at 46 hrs old.  Baby has had one attempt and two breastfeedings.  Baby sleeping STS on FOB's chest.  Reminded parents of safe sleep.  Mom exhausted and trying to rest.  When awake, recommended baby be STS with Mom.    Encouraged Mom to call for help prn.  Mom to offer breast with any cues.  Goal of >8 feedings per 24 hrs, but reminded of normal newborn sleepiness in first day.  Reviewed breast massage and hand expression, colostrum drops expressed.  Mom to call prn for assistance. Lactation brochure left in room.  Mom aware of 2 separate numbers to call after discharge for assistance.   Interventions Interventions: Breast feeding basics reviewed;Skin to skin;Breast massage;Hand express   Consult Status Consult Status: Follow-up Date: 12/23/18 Follow-up type: In-patient    Broadus John 12/22/2018, 7:11 PM

## 2018-12-23 ENCOUNTER — Other Ambulatory Visit (HOSPITAL_COMMUNITY)
Admission: RE | Admit: 2018-12-23 | Discharge: 2018-12-23 | Disposition: A | Discharge status: Home / Self Care | Payer: PRIVATE HEALTH INSURANCE | Source: Ambulatory Visit | Attending: Obstetrics & Gynecology | Admitting: Obstetrics & Gynecology

## 2018-12-23 LAB — CBC
HCT: 33.9 % — ABNORMAL LOW (ref 36.0–46.0)
Hemoglobin: 11.3 g/dL — ABNORMAL LOW (ref 12.0–15.0)
MCH: 28 pg (ref 26.0–34.0)
MCHC: 33.3 g/dL (ref 30.0–36.0)
MCV: 84.1 fL (ref 80.0–100.0)
Platelets: 252 10*3/uL (ref 150–400)
RBC: 4.03 MIL/uL (ref 3.87–5.11)
RDW: 16.3 % — ABNORMAL HIGH (ref 11.5–15.5)
WBC: 20.2 10*3/uL — ABNORMAL HIGH (ref 4.0–10.5)
nRBC: 0 % (ref 0.0–0.2)

## 2018-12-23 MED ORDER — BENZOCAINE-MENTHOL 20-0.5 % EX AERO: 1.0000 "application " | INHALATION_SPRAY | CUTANEOUS | 0 refills | Status: DC | PRN

## 2018-12-23 MED ORDER — IBUPROFEN 600 MG PO TABS: 600.0000 mg | ORAL_TABLET | Freq: Four times a day (QID) | ORAL | 0 refills | Status: DC

## 2018-12-23 NOTE — Lactation Note (Signed)
This note was copied from a baby's chart. Lactation Consultation Note  Patient Name: Alyssa Hodge [id number] Date: 12/23/2018 Reason for consult: Follow-up assessment;Primapara;1st time breastfeeding;Term  2300 - 2322 - I followed up with Alyssa Hodge. Her support person assisted with translating. She reports that baby last fed at 2030. He was cueing while in room, and I assisted with football hold on the left breast. Baby latched and maintained latch well with rhythmic suckling sequences for 7 minutes and then released the breast. He began to cry when I placed him on her chest. I discussed the second night feeding pattern and cluster feeding behavior.  Alyssa Hodge has a bottle of unopened formula at the bedside. She asked if she should offer a bottle, and I stated that it was her choice. It appears that baby is latching well, and when we hand expressed I noted a suitable amount of colostrum. I indicated that baby may be beginning a cluster feeding pattern (he just slept for 2.5 hours).  Dad is leaving for the night due to a work commitment. I encouraged Alyssa Hodge to call out for support tonight if she needs it. She appeared exhausted, and states that baby only wants to be held (no bassinet). I encouraged 8-12 feedings a day on demand.  I reassured lactation to follow up tomorrow.  Feeding Feeding Type: Breast Fed  LATCH Score Latch: Grasps breast easily, tongue down, lips flanged, rhythmical sucking.  Audible Swallowing: A few with stimulation  Type of Nipple: Everted at rest and after stimulation  Comfort (Breast/Nipple): Soft / non-tender  Hold (Positioning): Assistance needed to correctly position infant at breast and maintain latch.  LATCH Score: 8  Interventions Interventions: Breast feeding basics reviewed;Assisted with latch;Hand express;Breast compression;Support pillows;Adjust position  Lactation Tools Discussed/Used     Consult Status Consult Status: Follow-up Date:  12/24/18 Follow-up type: In-patient    Lenore Manner 12/23/2018, 11:37 PM

## 2018-12-23 NOTE — Anesthesia Postprocedure Evaluation (Signed)
Anesthesia Post Note  Patient: Alyssa Hodge  Procedure(s) Performed: AN AD HOC LABOR EPIDURAL     Patient location during evaluation: Mother Baby Anesthesia Type: Epidural Level of consciousness: awake and alert and oriented Pain management: satisfactory to patient Vital Signs Assessment: post-procedure vital signs reviewed and stable Respiratory status: respiratory function stable Cardiovascular status: stable Postop Assessment: no headache, no backache, epidural receding, patient able to bend at knees, no signs of nausea or vomiting and adequate PO intake Anesthetic complications: no    Last Vitals:  Vitals:   12/23/18 0110 12/23/18 0513  BP: 127/87 105/70  Pulse: 76 75  Resp: 18 18  Temp: 36.9 C 36.5 C  SpO2:      Last Pain:  Vitals:   12/23/18 0513  TempSrc: Oral  PainSc: 0-No pain   Pain Goal: Patients Stated Pain Goal: 3 (12/22/18 1810)                 Katherina Mires

## 2018-12-23 NOTE — Discharge Instructions (Signed)

## 2018-12-24 NOTE — Lactation Note (Signed)
This note was copied from a baby's chart. Lactation Consultation Note  Patient Name: Alyssa Hodge [id number] Date: 12/24/2018 Reason for consult: Term;1st time breastfeeding;Primapara  P1 mother whose infant is now 32 hours old.  [place] interpreter [phone]) used for interpretation.  Baby was asleep when I arrived but began waking during my visit.  Offered to assist with latching and mother accepted.  Mother's breasts are soft and non tender and nipples are rounded and very sensitive.  Mother asking why her nipples are "hurting."  I suggested a few explanations as to why this may be and reassured her I would attempt to latch him so mother would feel more comfortable.    Mother has not been feeding STS; suggested this always until he is breast feeding well.  Positioned mother appropriately and assisted baby to latch in the cross cradle position to the left breast.  Mother cried out in pain with initial latch.  She became concerned that he "could not breathe" due to the effective latch.  Educated her on correct positioning and hand/finger placement.  Baby had wide gape, flanged lips and began rhythmic sucking.  Provided relaxation techniques with mother who was still sensitive and tense.  As she began to slightly relax she stated that the pain was better but she could still "feel him" at the breast.  Provided more education about latching.  Demonstrated breast compressions and continued to educate while observing him feed for approximately 15 minutes.  Showed mother how to place a blanket roll for more support.  After 15 minutes, baby released.  Demonstrated burping with good results.  Swallows heard with feeding.  Placed baby STS on mother's chest.  He began cueing again.  Mother's nipple intact and slightly reddened but no trauma noted.  With oral assessment baby appears to have a small posterior frenulum.  He does not extend him tongue fully unless completely relaxed.  Provided suck training to help  relax him.  He remained very fussy after the first breast.  Asked mother to have pediatrician observe at the follow up visit.  Mother wanted to try football hold but remained very sensitive to the pain.  Offered a NS and demonstrated how to apply the NS.  Latched baby to the right breast easily and mother feels like the NS helps to ease the pain.  He began sucking agin and continued for another 5 minutes before becoming drowsy and releasing.  Again, burped well.  Mother will ask RN for coconut oil.  Explained how mother can use the NS at home as needed.  Suggested she hand express/ manually pump to obtain colostrum drops for baby.  She was not able to express any drops with either method.  Baby still very fussy.  Suggested mother supplement with some formula to settle baby.  Mother agreeable stating she was exhausted.  Demonstrated cup feeding and he took 10 mls, burped well and became calm.  Father back at this time and I placed baby in his arms.  Baby continued to be calm.    Father asking to be discharged.  I informed him that I would check with the RN and she would be in to complete discharge paperwork if the doctors had their orders in.  Father seemed somewhat restless with having to wait, stating he was told earlier today that it would not take long to be discharged.  Mother has a return pediatrician visit on Tuesday.  I suggested she return on Monday, especially since he does not  seem to be getting an adequate volume to keep him satisfied.  Mother stated that her husband is not off work until Tuesday so she wanted to wait until Tuesday.  RN updated.  She will complete discharge paperwork as soon as possible.   Maternal Data Formula Feeding for Exclusion: No Has patient been taught Hand Expression?: Yes Does the patient have breastfeeding experience prior to this delivery?: No  Feeding Feeding Type: Breast Fed  LATCH Score Latch: Grasps breast easily, tongue down, lips flanged, rhythmical  sucking.  Audible Swallowing: Spontaneous and intermittent  Type of Nipple: Everted at rest and after stimulation  Comfort (Breast/Nipple): Filling, red/small blisters or bruises, mild/mod discomfort  Hold (Positioning): Assistance needed to correctly position infant at breast and maintain latch.  LATCH Score: 8  Interventions Interventions: Breast feeding basics reviewed;Assisted with latch;Skin to skin;Breast massage;Hand express;Breast compression;Adjust position;Hand pump;Position options;Support pillows  Lactation Tools Discussed/Used Tools: Pump;Nipple [place] Fuel;Feeding cup Nipple shield size: 20 Breast pump type: Manual   Consult Status Consult Status: Complete Date: 12/24/18 Follow-up type: Call as needed    Yarieliz Wasser R Shelita Steptoe 12/24/2018, 12:50 PM

## 2018-12-24 NOTE — Discharge Summary (Signed)
OB Discharge Summary     Patient Name: Alyssa Hodge DOB: 1980-05-19 MRN: [id number]  Date of admission: 12/21/2018 Delivering MD: Sharyon Cable   Date of discharge: 12/24/2018  Admitting diagnosis: CTX Intrauterine pregnancy: [redacted]w[redacted]d     Secondary diagnosis:  Active Problems:   Transient hypertension   Post-dates pregnancy   SVD (spontaneous vaginal delivery)  Additional problems: None      Discharge diagnosis: Term Pregnancy Delivered and Gestational Hypertension                                                                                                Post partum procedures:N/A  Augmentation: AROM and Pitocin  Complications: None  Hospital course:  Onset of Labor With Vaginal Delivery     38 y.o. yo G1P1001 at [redacted]w[redacted]d was admitted in Latent Labor on 12/21/2018. Patient had an uncomplicated labor course as follows:  Membrane Rupture Time/Date: 1:57 AM ,12/22/2018   Intrapartum Procedures: Episiotomy: None [1]                                         Lacerations:  Labial [10]  Patient had a delivery of a Viable infant. 12/22/2018  Information for the patient's newborn:  Chinyere, Galiano [[id number]]  Delivery Method: Vag-Spont     Pateint had an uncomplicated postpartum course.  She is ambulating, tolerating a regular diet, passing flatus, and urinating well. Patient is discharged home in stable condition on 12/24/18.   Physical exam  Vitals:   12/23/18 1418 12/23/18 1436 12/23/18 2221 12/24/18 0527  BP: (!) 132/94 120/79 118/82 109/69  Pulse: 71 78 77 63  Resp: 16 18 18 18   Temp: 97.9 F (36.6 C)  98.8 F (37.1 C) 98 F (36.7 C)  TempSrc: Oral  Oral Oral  SpO2:   100%   Weight:      Height:       General: alert and cooperative Lochia: appropriate Uterine Fundus: firm Incision: N/A DVT Evaluation: No evidence of DVT seen on physical exam. Labs: Lab Results  Component Value Date   WBC 20.2 (H) 12/23/2018   HGB 11.3 (L) 12/23/2018   HCT 33.9 (L)  12/23/2018   MCV 84.1 12/23/2018   PLT 252 12/23/2018   CMP Latest Ref Rng & Units 12/21/2018  Glucose 70 - 99 mg/dL 84  BUN 6 - 20 mg/dL 7  Creatinine 12/23/2018 - 2.40 mg/dL 9.73  Sodium 5.32 - 992 mmol/L 135  Potassium 3.5 - 5.1 mmol/L 3.7  Chloride 98 - 111 mmol/L 104  CO2 22 - 32 mmol/L 19(L)  Calcium 8.9 - 10.3 mg/dL 9.5  Total Protein 6.5 - 8.1 g/dL 7.2  Total Bilirubin 0.3 - 1.2 mg/dL 0.4  Alkaline Phos 38 - 126 U/L 193(H)  AST 15 - 41 U/L 28  ALT 0 - 44 U/L 19    Discharge instruction: per After Visit Summary and "Baby and Me Booklet".  After visit meds:  Allergies as of 12/24/2018   No Known Allergies  Medication List    STOP taking these medications   Blood Pressure Monitor Misc   promethazine 25 MG tablet Commonly known as: PHENERGAN   Vitamin D-3 25 MCG (1000 UT) Caps     TAKE these medications   acetaminophen 500 MG tablet Commonly known as: TYLENOL Take 500 mg by mouth every 6 (six) hours as needed for mild pain or headache.   benzocaine-Menthol 20-0.5 % Aero Commonly known as: DERMOPLAST Apply 1 application topically as needed for irritation (perineal discomfort).   doxylamine (Sleep) 25 MG tablet Commonly known as: UNISOM Take 25 mg by mouth at bedtime as needed for sleep.   ibuprofen 600 MG tablet Commonly known as: ADVIL Take 1 tablet (600 mg total) by mouth every 6 (six) hours.   PRENATAL VITAMIN PO Take 1 tablet by mouth daily with breakfast.       Diet: routine diet  Activity: Advance as tolerated. Pelvic rest for 6 weeks.   Outpatient follow up:4 weeks Follow up Appt: Future Appointments  Date Time Provider [place]  12/29/2018 10:30 AM [facility] NURSE [facility]  01/31/2019 10:30 AM Cresenzo-Dishmon, Joaquim Lai, CNM [facility]   Follow up Visit:No follow-ups on file.  Postpartum contraception: POPs vs. Nexplanon. Plans to discuss further with husband and will decide prior to PP visit.   Newborn Data: Live  born female  Birth Weight: 6 lb 8.1 oz (2951 g) APGAR: 7, 9  Newborn Delivery   Birth date/time: 12/22/2018 13:40:00 Delivery type: Vaginal, Spontaneous      Baby Feeding: Breast Disposition:home with mother   12/24/2018 Melina Schools, DO

## 2018-12-25 ENCOUNTER — Inpatient Hospital Stay (HOSPITAL_COMMUNITY): Payer: PRIVATE HEALTH INSURANCE

## 2018-12-25 ENCOUNTER — Inpatient Hospital Stay (HOSPITAL_COMMUNITY)
Admission: AD | Admit: 2018-12-25 | Payer: PRIVATE HEALTH INSURANCE | Source: Home / Self Care | Admitting: Obstetrics & Gynecology

## 2018-12-27 ENCOUNTER — Telehealth: Payer: Self-pay | Admitting: Obstetrics & Gynecology

## 2018-12-27 NOTE — Telephone Encounter (Signed)

## 2018-12-28 ENCOUNTER — Ambulatory Visit: Payer: PRIVATE HEALTH INSURANCE

## 2018-12-28 ENCOUNTER — Other Ambulatory Visit: Payer: Self-pay

## 2018-12-28 DIAGNOSIS — Z0130 Encounter for examination of blood pressure without abnormal findings: Secondary | ICD-10-CM

## 2018-12-28 NOTE — Progress Notes (Signed)
   NURSE VISIT- BLOOD PRESSURE CHECK  SUBJECTIVE:  Alyssa Hodge is a 38 y.o. G16P1001 female here for BP check. She is postpartum delivey     OBJECTIVE:  BP 126/76 (BP Location: Right Arm, Patient Position: Sitting, Cuff Size: Normal)   Pulse 77   Wt 140 lb (63.5 kg)   BMI 21.47 kg/m   Appearance alert, well appearing, and in no distress.  ASSESSMENT: Postpartum  Delivery for blood pressure check  PLAN: Discussed with Knute Neu, CNM, [facility]   Recommendations: no changes needed   Follow-up: in 4 weeks   Ladonna Snide  12/28/2018 10:33 AM   Chart reviewed for nurse visit. Agree with plan of care.  Roma Schanz, [place] 12/28/2018 1:55 PM

## 2018-12-29 ENCOUNTER — Telehealth: Payer: PRIVATE HEALTH INSURANCE

## 2019-01-31 ENCOUNTER — Telehealth: Payer: PRIVATE HEALTH INSURANCE | Admitting: Advanced Practice Midwife

## 2019-02-01 ENCOUNTER — Telehealth: Payer: PRIVATE HEALTH INSURANCE | Admitting: Women's Health

## 2019-02-02 ENCOUNTER — Telehealth: Payer: PRIVATE HEALTH INSURANCE | Admitting: Advanced Practice Midwife

## 2019-02-02 ENCOUNTER — Telehealth (INDEPENDENT_AMBULATORY_CARE_PROVIDER_SITE_OTHER): Payer: PRIVATE HEALTH INSURANCE | Admitting: Advanced Practice Midwife

## 2019-02-02 ENCOUNTER — Other Ambulatory Visit: Payer: Self-pay

## 2019-02-02 NOTE — Progress Notes (Signed)
TELEHEALTH VIRTUAL POSTPARTUM VISIT ENCOUNTER NOTE Patient name: Alyssa Hodge MRN [id number]  Date of birth: 1980-06-02  I connected with patient on 02/02/19 at  3:10 PM EST by MyChart and verified that I am speaking with the correct person using two identifiers. Due to COVID-19 recommendations, pt is not currently in our office.    I discussed the limitations, risks, security and privacy concerns of performing an evaluation and management service by telephone and the availability of in person appointments. I also discussed with the patient that there may be a patient responsible charge related to this service. The patient expressed understanding and agreed to proceed.  Chief Complaint:   Postpartum Care  History of Present Illness:   Alyssa Hodge is a 38 y.o. G13P1001 Caucasian female being evaluated today for a postpartum visit. She is 6 weeks postpartum following a spontaneous vaginal delivery at 40.4 gestational weeks. Anesthesia: epidural. Laceration: labial. I have fully reviewed the prenatal and intrapartum course. Pregnancy complicated by transient elevated BPs in [facility], mostly normal BPs during her PP stay, and nl BP a week PP. Postpartum course has been uncomplicated. Bleeding no bleeding. Bowel function is normal. Bladder function is normal.  Patient is not sexually active.   Contraception method is unsure> rev'd DMPA, IUD, Nex, POPs.  Last pap 07/2018 with LSIL, colpo 09/2018- needs colposcopy and bx 8wks PP.   No LMP recorded.  Baby's course has been uncomplicated. Baby is feeding by breast   Inocente Salles Postpartum Depression Screening: did not complete screen on 02/02/19, but she denied feelings of depression; was contacted on 12/3 and asked to do the screening   Review of Systems:   Pertinent items are noted in HPI Denies Abnormal vaginal discharge w/ itching/odor/irritation, headaches, visual changes, shortness of breath, chest pain, abdominal pain, severe nausea/vomiting, or problems  with urination or bowel movements. Pertinent History Reviewed:  Reviewed past medical,surgical, obstetrical and family history.  Reviewed problem list, medications and allergies. OB History  Gravida Para Term Preterm AB Living  1 1 1     1   SAB TAB Ectopic Multiple Live Births        0 1    # Outcome Date GA Lbr Len/2nd Weight Sex Delivery Anes PTL Lv  1 Term 12/22/18 [redacted]w[redacted]d / 02:23 6 lb 8.1 oz (2.951 kg) M Vag-Spont EPI  LIV   Physical Assessment:  There were no vitals filed for this visit.There is no height or weight on file to calculate BMI.        Physical Examination:  General:  Alert, oriented and cooperative.   Mental Status: Normal mood and affect perceived. Normal judgment and thought content.  Rest of physical exam deferred due to type of encounter       No results found for this or any previous visit (from the past 24 hour(s)).  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   I discussed the  assessment and treatment plan with the patient. The patient was provided an opportunity to ask questions and all were answered. The patient agreed with the plan and demonstrated an understanding of the instructions.   The patient was advised to call back or seek an in-person evaluation/go to the ED for any concerning postpartum symptoms.  I provided 10 minutes of non-face-to-face time during this encounter.  Follow-up: msg sent to schedule colposcopy with Dr Elonda Husky  in 2-3 weeks   No orders of the defined types were placed in this encounter.   Myrtis Ser CNM 02/02/2019 5:11 PM

## 2019-02-03 ENCOUNTER — Encounter: Payer: Self-pay | Admitting: *Deleted

## 2019-02-03 ENCOUNTER — Encounter: Payer: Self-pay | Admitting: Advanced Practice Midwife

## 2019-02-15 ENCOUNTER — Encounter: Payer: PRIVATE HEALTH INSURANCE | Admitting: Obstetrics & Gynecology

## 2019-03-08 ENCOUNTER — Encounter: Payer: PRIVATE HEALTH INSURANCE | Admitting: Obstetrics & Gynecology

## 2019-04-05 ENCOUNTER — Encounter: Payer: Self-pay | Admitting: Obstetrics & Gynecology

## 2019-04-19 ENCOUNTER — Encounter: Payer: Self-pay | Admitting: Obstetrics & Gynecology

## 2019-05-31 ENCOUNTER — Encounter: Payer: Self-pay | Admitting: Obstetrics & Gynecology

## 2019-06-11 ENCOUNTER — Ambulatory Visit: Payer: Medicaid Other | Attending: Internal Medicine

## 2019-06-11 DIAGNOSIS — Z23 Encounter for immunization: Secondary | ICD-10-CM

## 2019-06-11 NOTE — Progress Notes (Signed)
   Covid-19 Vaccination Clinic  Name:  Belicia Difatta    MRN: [id number] DOB: Jan 24, 1981  06/11/2019  Ms. Dascoli was observed post Covid-19 immunization for 15 minutes without incident. She was provided with Vaccine Information Sheet and instruction to access the V-Safe system.   Ms. Cardella was instructed to call 911 with any severe reactions post vaccine: Marland Kitchen Difficulty breathing  . Swelling of face and throat  . A fast heartbeat  . A bad rash all over body  . Dizziness and weakness   Immunizations Administered    Name Date Dose VIS Date Route   Pfizer COVID-19 Vaccine 06/11/2019  8:38 AM 0.3 mL 02/11/2019 Intramuscular   Manufacturer: [employer], [employer]   Lot: [id number]   NDC: [address]-3

## 2019-07-05 ENCOUNTER — Ambulatory Visit: Payer: Medicaid Other | Attending: Internal Medicine

## 2019-07-05 DIAGNOSIS — Z23 Encounter for immunization: Secondary | ICD-10-CM

## 2019-07-05 NOTE — Progress Notes (Signed)
   Covid-19 Vaccination Clinic  Name:  Kemi Gell    MRN: [id number] DOB: [birth year]  07/05/2019  Ms. Gonsoulin was observed post Covid-19 immunization for 15 minutes without incident. She was provided with Vaccine Information Sheet and instruction to access the V-Safe system.   Ms. Medine was instructed to call 911 with any severe reactions post vaccine: Marland Kitchen Difficulty breathing  . Swelling of face and throat  . A fast heartbeat  . A bad rash all over body  . Dizziness and weakness   Immunizations Administered    Name Date Dose VIS Date Route   Pfizer COVID-19 Vaccine 07/05/2019 10:07 AM 0.3 mL 04/27/2018 Intramuscular   Manufacturer: [employer], [employer]   Lot: [id number]   NDC: [address]-6

## 2019-07-19 ENCOUNTER — Encounter: Payer: Self-pay | Admitting: Obstetrics & Gynecology

## 2019-08-16 ENCOUNTER — Encounter: Payer: Self-pay | Admitting: Obstetrics and Gynecology

## 2019-08-17 ENCOUNTER — Encounter: Payer: Self-pay | Admitting: Obstetrics and Gynecology

## 2019-08-30 ENCOUNTER — Encounter: Payer: Self-pay | Admitting: Obstetrics and Gynecology

## 2019-09-15 ENCOUNTER — Ambulatory Visit
Admission: EM | Admit: 2019-09-15 | Discharge: 2019-09-15 | Disposition: A | Discharge status: Home / Self Care | Payer: Medicaid Other | Attending: Emergency Medicine | Admitting: Emergency Medicine

## 2019-09-15 ENCOUNTER — Encounter: Payer: Self-pay | Admitting: Emergency Medicine

## 2019-09-15 ENCOUNTER — Other Ambulatory Visit: Payer: Self-pay

## 2019-09-15 DIAGNOSIS — Z20822 Contact with and (suspected) exposure to covid-19: Secondary | ICD-10-CM

## 2019-09-15 NOTE — ED Provider Notes (Signed)
[facility]   [id number] 09/15/19 Arrival Time: 1340   CC: COVID test  SUBJECTIVE: History from: patient.  Alyssa Hodge is a 39 y.o. female who presents for COVID testing.  Denies sick exposure to COVID, flu or strep.  Traveling out of the country this weekend. Denies aggravating or alleviating symptoms.  Denies previous COVID infection.   Denies fever, chills, fatigue, nasal congestion, rhinorrhea, sore throat, cough, SOB, wheezing, chest pain, nausea, vomiting, changes in bowel or bladder habits.     ROS: As per HPI.  All other pertinent ROS negative.     Past Medical History:  Diagnosis Date  . Medical history non-contributory    Past Surgical History:  Procedure Laterality Date  . NO PAST SURGERIES     No Known Allergies No current facility-administered medications on file prior to encounter.   Current Outpatient Medications on File Prior to Encounter  Medication Sig Dispense Refill  . acetaminophen (TYLENOL) 500 MG tablet Take 500 mg by mouth every 6 (six) hours as needed for mild pain or headache.    . benzocaine-Menthol (DERMOPLAST) 20-0.5 % AERO Apply 1 application topically as needed for irritation (perineal discomfort). 56 g 0  . doxylamine, Sleep, (UNISOM) 25 MG tablet Take 25 mg by mouth at bedtime as needed for sleep.    Marland Kitchen ibuprofen (ADVIL) 600 MG tablet Take 1 tablet (600 mg total) by mouth every 6 (six) hours. 30 tablet 0  . Prenatal Vit-Fe Fumarate-FA (PRENATAL VITAMIN PO) Take 1 tablet by mouth daily with breakfast.      Social History   Socioeconomic History  . Marital status: Married    Spouse name: Thornell Mule  . Number of children: 1  . Years of education: Not on file  . Highest education level: Not on file  Occupational History  . Not on file  Tobacco Use  . Smoking status: Never Smoker  . Smokeless tobacco: Never Used  Vaping Use  . Vaping Use: Never used  Substance and Sexual Activity  . Alcohol use: Never  . Drug use: Never  .  Sexual activity: Not Currently    Birth control/protection: None  Other Topics Concern  . Not on file  Social History Narrative  . Not on file   Social Determinants of Health   Financial Resource Strain: Low Risk   . Difficulty of Paying Living Expenses: Not hard at all  Food Insecurity: No Food Insecurity  . Worried About Programme researcher, broadcasting/film/video in the Last Year: Never true  . Ran Out of Food in the Last Year: Never true  Transportation Needs: No Transportation Needs  . Lack of Transportation (Medical): No  . Lack of Transportation (Non-Medical): No  Physical Activity: Inactive  . Days of Exercise per Week: 0 days  . Minutes of Exercise per Session: 0 min  Stress: No Stress Concern Present  . Feeling of Stress : Not at all  Social Connections: Unknown  . Frequency of Communication with Friends and Family: Three times a week  . Frequency of Social Gatherings with Friends and Family: Three times a week  . Attends Religious Services: Never  . Active Member of Clubs or Organizations: No  . Attends Banker Meetings: Never  . Marital Status: Not on file  Intimate Partner Violence: Not At Risk  . Fear of Current or Ex-Partner: No  . Emotionally Abused: No  . Physically Abused: No  . Sexually Abused: No   Family History  Problem Relation Age of  Onset  . Cancer Paternal Grandfather   . Hypertension Maternal Grandmother   . Heart disease Maternal Grandfather   . Hypertension Father   . Hypertension Mother   . Heart disease Mother     OBJECTIVE:  Vitals:   09/15/19 1354 09/15/19 1403  BP:  109/77  Pulse:  95  Resp:  17  Temp:  99.2 F (37.3 C)  TempSrc:  Oral  SpO2:  98%  Weight: 138 lb 14.2 oz (63 kg)   Height: 5' (1.524 m)      General appearance: alert; well-appearing, nontoxic; speaking in full sentences and tolerating own secretions HEENT: NCAT; Ears: EACs clear; Eyes: EOM grossly intact.Nose: nares patent without rhinorrhea, Throat: airway  patent Neck: FROM Lungs: normal respiratory effort Skin: warm and dry Psychological: alert and cooperative; normal mood and affect   ASSESSMENT & PLAN:  1. Encounter for laboratory testing for COVID-19 virus     COVID testing ordered.  It will take between 2-5 days for test results.  Someone will contact you regarding abnormal results.    In the meantime:  If you were to develop symptoms: You should remain isolated in your home for 10 days from symptom onset AND greater than 72 hours after symptoms resolution (absence of fever without the use of fever-reducing medication and improvement in respiratory symptoms), whichever is longer If you have had exposure: you should remain in quarantine for 7 days from exposure.  However, if symptoms develop you must self isolate and return for retesting Get plenty of rest and push fluids Follow up with PCP as needed Call or go to the ED if you have any new symptoms such as fever, cough, shortness of breath, chest tightness, chest pain, turning blue, changes in mental status, etc...   Reviewed expectations re: course of current medical issues. Questions answered. Outlined signs and symptoms indicating need for more acute intervention. Patient verbalized understanding. After Visit Summary given.         Rennis Harding, PA-C 09/15/19 1416

## 2019-09-15 NOTE — ED Triage Notes (Signed)
Need covid test for travel on Sunday

## 2019-09-15 NOTE — Discharge Instructions (Signed)

## 2019-09-16 LAB — NOVEL CORONAVIRUS, NAA: SARS-CoV-2, NAA: NOT DETECTED

## 2019-09-16 LAB — SARS-COV-2, NAA 2 DAY TAT

## 2020-01-31 ENCOUNTER — Telehealth: Payer: Self-pay | Admitting: *Deleted

## 2020-01-31 ENCOUNTER — Telehealth: Payer: Self-pay | Admitting: Women's Health

## 2020-01-31 ENCOUNTER — Other Ambulatory Visit: Payer: Self-pay | Admitting: Women's Health

## 2020-01-31 NOTE — Telephone Encounter (Signed)
Patient is requesting Reglan be sent in for nausea in early pregnancy.  Tried several different meds with the last pregnancy and this one worked the best for her.

## 2020-01-31 NOTE — Telephone Encounter (Signed)
Pt is pregnant, having nausea, can we order meds?  Husband states that the 2nd medicine we ordered with her last pregnancy worked best  Please advise & notify pt    [facility]

## 2020-02-01 ENCOUNTER — Telehealth: Payer: Self-pay | Admitting: *Deleted

## 2020-02-01 MED ORDER — PROMETHAZINE HCL 25 MG PO TABS: 25.0000 mg | ORAL_TABLET | Freq: Four times a day (QID) | ORAL | 1 refills | Status: DC | PRN

## 2020-02-01 NOTE — Addendum Note (Signed)
Addended by: Cyril Mourning A on: 02/01/2020 12:32 PM   Modules accepted: Orders

## 2020-02-01 NOTE — Telephone Encounter (Signed)
Will rx phenergan  

## 2020-02-01 NOTE — Telephone Encounter (Signed)
Spoke with husband and informed him that Reglan is typically not the first nausea medication we prescribe in pregnancy.  Alyssa Hodge asked if she would be willing to try Diclegis or Phenergan first as every pregnancy is different.  Requests Phenergan first.  Advised to check back with pharmacy later today.

## 2020-02-07 ENCOUNTER — Other Ambulatory Visit: Payer: Self-pay

## 2020-02-07 ENCOUNTER — Encounter: Payer: Self-pay | Admitting: *Deleted

## 2020-02-07 ENCOUNTER — Ambulatory Visit (INDEPENDENT_AMBULATORY_CARE_PROVIDER_SITE_OTHER): Payer: Self-pay | Admitting: *Deleted

## 2020-02-07 VITALS — BP 116/80 | HR 87 | Ht 67.0 in | Wt 141.0 lb

## 2020-02-07 DIAGNOSIS — Z3201 Encounter for pregnancy test, result positive: Secondary | ICD-10-CM

## 2020-02-07 LAB — POCT URINE PREGNANCY: Preg Test, Ur: POSITIVE — AB

## 2020-02-07 MED ORDER — ONDANSETRON 4 MG PO TBDP: 4.0000 mg | ORAL_TABLET | Freq: Three times a day (TID) | ORAL | 0 refills | Status: DC | PRN

## 2020-02-07 MED ORDER — PRENATAL PLUS 27-1 MG PO TABS: 1.0000 | ORAL_TABLET | Freq: Every day | ORAL | 12 refills | Status: DC

## 2020-02-07 NOTE — Progress Notes (Signed)
This patient does not appear to be part of my practice.  I do not know why I am receiving this?

## 2020-02-07 NOTE — Progress Notes (Signed)
Chart reviewed for nurse visit. Agree with plan of care.  Adline Potter, NP 02/07/2020 12:59 PM

## 2020-02-07 NOTE — Progress Notes (Signed)
Chart reviewed for nurse visit. Agree with plan of care. Will rx PNV and Zofran 4 mg Adline Potter, NP 02/07/2020 1:01 PM

## 2020-02-07 NOTE — Progress Notes (Signed)
   NURSE VISIT- PREGNANCY CONFIRMATION   SUBJECTIVE:  Alyssa Hodge is a 39 y.o. G71P1001 female at [redacted]w[redacted]d by certain LMP of Patient's last menstrual period was 12/12/2019. Here for pregnancy confirmation.  Home pregnancy test: positive x 1  She reports nausea.  She is not taking prenatal vitamins.    OBJECTIVE:  BP 116/80 (BP Location: Left Arm, Patient Position: Sitting, Cuff Size: Normal)   Pulse 87   Ht 5\' 7"  (1.702 m)   Wt 141 lb (64 kg)   LMP 12/12/2019   Breastfeeding No   BMI 22.08 kg/m   Appears well, in no apparent distress OB History  Gravida Para Term Preterm AB Living  2 1 1     1   SAB TAB Ectopic Multiple Live Births        0 1    # Outcome Date GA Lbr Len/2nd Weight Sex Delivery Anes PTL Lv  2 Current           1 Term 12/22/18 [redacted]w[redacted]d / 02:23 6 lb 8.1 oz (2.951 kg) M Vag-Spont EPI  LIV    Results for orders placed or performed in visit on 02/07/20 (from the past 24 hour(s))  POCT urine pregnancy   Collection Time: 02/07/20 11:11 AM  Result Value Ref Range   Preg Test, Ur Positive (A) Negative    ASSESSMENT: Positive pregnancy test, [redacted]w[redacted]d by LMP    PLAN: Schedule for dating ultrasound in 2 weeks Prenatal vitamins: note routed to [facility] to send prescription   Nausea medicines: requested-note routed to [facility] to send prescription   OB packet given: Yes  14/07/21  02/07/2020 11:40 AM

## 2020-02-07 NOTE — Addendum Note (Signed)
Addended by: Cyril Mourning A on: 02/07/2020 01:02 PM   Modules accepted: Orders

## 2020-02-20 ENCOUNTER — Other Ambulatory Visit: Payer: Self-pay | Admitting: Adult Health

## 2020-02-24 ENCOUNTER — Inpatient Hospital Stay (HOSPITAL_COMMUNITY)
Admission: EM | Admit: 2020-02-24 | Discharge: 2020-02-24 | Disposition: A | Discharge status: Home / Self Care | Payer: 59 | Attending: Obstetrics and Gynecology | Admitting: Obstetrics and Gynecology

## 2020-02-24 ENCOUNTER — Encounter (HOSPITAL_COMMUNITY): Payer: Self-pay | Admitting: Obstetrics and Gynecology

## 2020-02-24 ENCOUNTER — Other Ambulatory Visit: Payer: Self-pay

## 2020-02-24 ENCOUNTER — Inpatient Hospital Stay (HOSPITAL_COMMUNITY): Payer: 59

## 2020-02-24 DIAGNOSIS — O208 Other hemorrhage in early pregnancy: Secondary | ICD-10-CM | POA: Diagnosis present

## 2020-02-24 DIAGNOSIS — E876 Hypokalemia: Secondary | ICD-10-CM | POA: Insufficient documentation

## 2020-02-24 DIAGNOSIS — Z3A10 10 weeks gestation of pregnancy: Secondary | ICD-10-CM | POA: Insufficient documentation

## 2020-02-24 DIAGNOSIS — O039 Complete or unspecified spontaneous abortion without complication: Secondary | ICD-10-CM | POA: Insufficient documentation

## 2020-02-24 DIAGNOSIS — Z6790 Unspecified blood type, Rh positive: Secondary | ICD-10-CM | POA: Diagnosis not present

## 2020-02-24 DIAGNOSIS — O4690 Antepartum hemorrhage, unspecified, unspecified trimester: Secondary | ICD-10-CM

## 2020-02-24 LAB — CBC
HCT: 40.9 % (ref 36.0–46.0)
Hemoglobin: 13.3 g/dL (ref 12.0–15.0)
MCH: 27.8 pg (ref 26.0–34.0)
MCHC: 32.5 g/dL (ref 30.0–36.0)
MCV: 85.6 fL (ref 80.0–100.0)
Platelets: 322 10*3/uL (ref 150–400)
RBC: 4.78 MIL/uL (ref 3.87–5.11)
RDW: 12.8 % (ref 11.5–15.5)
WBC: 12.7 10*3/uL — ABNORMAL HIGH (ref 4.0–10.5)
nRBC: 0 % (ref 0.0–0.2)

## 2020-02-24 LAB — COMPREHENSIVE METABOLIC PANEL
ALT: 15 U/L (ref 0–44)
AST: 20 U/L (ref 15–41)
Albumin: 4.1 g/dL (ref 3.5–5.0)
Alkaline Phosphatase: 53 U/L (ref 38–126)
Anion gap: 11 (ref 5–15)
BUN: 9 mg/dL (ref 6–20)
CO2: 20 mmol/L — ABNORMAL LOW (ref 22–32)
Calcium: 9.1 mg/dL (ref 8.9–10.3)
Chloride: 103 mmol/L (ref 98–111)
Creatinine, Ser: 0.73 mg/dL (ref 0.44–1.00)
GFR, Estimated: >60 mL/min (ref >60)
Glucose, Bld: 103 mg/dL — ABNORMAL HIGH (ref 70–99)
Potassium: 3.1 mmol/L — ABNORMAL LOW (ref 3.5–5.1)
Sodium: 134 mmol/L — ABNORMAL LOW (ref 135–145)
Total Bilirubin: 0.7 mg/dL (ref 0.3–1.2)
Total Protein: 7.5 g/dL (ref 6.5–8.1)

## 2020-02-24 LAB — HCG, QUANTITATIVE, PREGNANCY: hCG, Beta Chain, Quant, S: 6237 m[IU]/mL — ABNORMAL HIGH (ref <5)

## 2020-02-24 MED ORDER — POTASSIUM CHLORIDE ER 10 MEQ PO TBCR: 10.0000 meq | EXTENDED_RELEASE_TABLET | Freq: Every day | ORAL | 0 refills | Status: DC

## 2020-02-24 MED ORDER — IBUPROFEN 600 MG PO TABS: 600.0000 mg | ORAL_TABLET | Freq: Four times a day (QID) | ORAL | 0 refills | Status: DC | PRN

## 2020-02-24 MED ORDER — KETOROLAC TROMETHAMINE 30 MG/ML IJ SOLN
30.0000 mg | Freq: Once | INTRAMUSCULAR | Status: AC
  Administered 2020-02-24: 30 mg via INTRAVENOUS
  Filled 2020-02-24: qty 1

## 2020-02-24 MED ORDER — LACTATED RINGERS IV SOLN: INTRAVENOUS | Status: DC

## 2020-02-24 MED ORDER — METHYLERGONOVINE MALEATE 0.2 MG PO TABS: 0.2000 mg | ORAL_TABLET | Freq: Three times a day (TID) | ORAL | 0 refills | Status: DC

## 2020-02-24 MED ORDER — METHYLERGONOVINE MALEATE 0.2 MG PO TABS
0.2000 mg | ORAL_TABLET | Freq: Once | ORAL | Status: AC
  Administered 2020-02-24: 0.2 mg via ORAL
  Filled 2020-02-24: qty 1

## 2020-02-24 MED ORDER — MISOPROSTOL 200 MCG PO TABS
800.0000 ug | ORAL_TABLET | Freq: Once | ORAL | Status: AC
  Administered 2020-02-24: 800 ug via RECTAL
  Filled 2020-02-24: qty 4

## 2020-02-24 MED ORDER — ACETAMINOPHEN 325 MG PO TABS: 650.0000 mg | ORAL_TABLET | Freq: Four times a day (QID) | ORAL | 0 refills | Status: DC | PRN

## 2020-02-24 MED ORDER — OXYCODONE-ACETAMINOPHEN 5-325 MG PO TABS
1.0000 | ORAL_TABLET | Freq: Once | ORAL | Status: AC
  Administered 2020-02-24: 1 via ORAL
  Filled 2020-02-24: qty 1

## 2020-02-24 MED ORDER — DOXYCYCLINE HYCLATE 100 MG PO CAPS: 100.0000 mg | ORAL_CAPSULE | Freq: Two times a day (BID) | ORAL | 0 refills | Status: AC

## 2020-02-24 NOTE — ED Triage Notes (Signed)
Emergency Medicine Provider OB Triage Evaluation Note  Alyssa Hodge is a 39 y.o. female, G2P1001, at [redacted]w[redacted]d gestation who presents to the emergency department with complaints of vaginal bleeding. She presents to ED complaining of heavy vaginal bleeding that started this morning.  She is approximately 3 months pregnant.   Review of  Systems  Positive: cramping, bleeding   Physical Exam  BP 123/85   Pulse (!) 102   Temp 98.4 F (36.9 C) (Oral)   Resp 18   Ht 5' 7.72" (1.72 m)   Wt 59 kg   LMP 12/12/2019   SpO2 100%   BMI 19.93 kg/m  General: Awake, uncomfortable appearing HEENT: Atraumatic  Resp: Normal effort Cardiac: tachycardic MSK: Moves all extremities without difficulty Neuro: Speech clear  Medical Decision Making  Pt evaluated for pregnancy concern and is stable for transfer to MAU. Pt is in agreement with plan for transfer.  8:28 AM Discussed with MAU APP, who accepts patient in transfer.  Clinical Impression  No diagnosis found.     Tilden Fossa, MD 02/24/20 1420

## 2020-02-24 NOTE — ED Notes (Signed)
Pt assessed by Dr. Madilyn Hook in triage. Report called to MAU charge. Pt transferred to MAU with this RN.

## 2020-02-24 NOTE — Discharge Instructions (Signed)
Miscarriage A miscarriage is the loss of an unborn baby (fetus) before the 20th week of pregnancy. Most miscarriages happen during the first 3 months of pregnancy. Sometimes, a miscarriage can happen before a woman knows that she is pregnant. Having a miscarriage can be an emotional experience. If you have had a miscarriage, talk with your health care provider about any questions you may have about miscarrying, the grieving process, and your plans for future pregnancy. What are the causes? A miscarriage may be caused by:  Problems with the genes or chromosomes of the fetus. These problems make it impossible for the baby to develop normally. They are often the result of random errors that occur early in the development of the baby, and are not passed from parent to child (not inherited).  Infection of the cervix or uterus.  Conditions that affect hormone balance in the body.  Problems with the cervix, such as the cervix opening and thinning before pregnancy is at term (cervical insufficiency).  Problems with the uterus. These may include: ? A uterus with an abnormal shape. ? Fibroids in the uterus. ? Congenital abnormalities. These are problems that were present at birth.  Certain medical conditions.  Smoking, drinking alcohol, or using drugs.  Injury (trauma). In many cases, the cause of a miscarriage is not known. What are the signs or symptoms? Symptoms of this condition include:  Vaginal bleeding or spotting, with or without cramps or pain.  Pain or cramping in the abdomen or lower back.  Passing fluid, tissue, or blood clots from the vagina. How is this diagnosed? This condition may be diagnosed based on:  A physical exam.  Ultrasound.  Blood tests.  Urine tests. How is this treated? Treatment for a miscarriage is sometimes not necessary if you naturally pass all the tissue that was in your uterus. If necessary, this condition may be treated with:  Dilation and  curettage (D&C). This is a procedure in which the cervix is stretched open and the lining of the uterus (endometrium) is scraped. This is done only if tissue from the fetus or placenta remains in the body (incomplete miscarriage).  Medicines, such as: ? Antibiotic medicine, to treat infection. ? Medicine to help the body pass any remaining tissue. ? Medicine to reduce (contract) the size of the uterus. These medicines may be given if you have a lot of bleeding. If you have Rh negative blood and your baby was Rh positive, you will need a shot of a medicine called Rh immunoglobulinto protect your future babies from Rh blood problems. "Rh-negative" and "Rh-positive" refer to whether or not the blood has a specific protein found on the surface of red blood cells (Rh factor). Follow these instructions at home: Medicines   Take over-the-counter and prescription medicines only as told by your health care provider.  If you were prescribed antibiotic medicine, take it as told by your health care provider. Do not stop taking the antibiotic even if you start to feel better.  Do not take NSAIDs, such as aspirin and ibuprofen, unless they are approved by your health care provider. These medicines can cause bleeding. Activity  Rest as directed. Ask your health care provider what activities are safe for you.  Have someone help with home and family responsibilities during this time. General instructions  Keep track of the number of sanitary pads you use each day and how soaked (saturated) they are. Write down this information.  Monitor the amount of tissue or blood clots that   you pass from your vagina. Save any large amounts of tissue for your health care provider to examine.  Do not use tampons, douche, or have sex until your health care provider approves.  To help you and your partner with the process of grieving, talk with your health care provider or seek counseling.  When you are ready, meet with  your health care provider to discuss any important steps you should take for your health. Also, discuss steps you should take to have a healthy pregnancy in the future.  Keep all follow-up visits as told by your health care provider. This is important. Where to find more information  The American Congress of Obstetricians and Gynecologists: www.acog.org  U.S. Department of Health and Programmer, systems of Women's Health: [place] Contact a health care provider if:  You have a fever or chills.  You have a foul smelling vaginal discharge.  You have more bleeding instead of less. Get help right away if:  You have severe cramps or pain in your back or abdomen.  You pass blood clots or tissue from your vagina that is walnut-sized or larger.  You soak more than 1 regular sanitary pad in an hour.  You become light-headed or weak.  You pass out.  You have feelings of sadness that take over your thoughts, or you have thoughts of hurting yourself. Summary  Most miscarriages happen in the first 3 months of pregnancy. Sometimes miscarriage happens before a woman even knows that she is pregnant.  Follow your health care provider's instruction for home care. Keep all follow-up appointments.  To help you and your partner with the process of grieving, talk with your health care provider or seek counseling. This information is not intended to replace advice given to you by your health care provider. Make sure you discuss any questions you have with your health care provider. Document Revised: 06/11/2018 Document Reviewed: 03/25/2016 Elsevier Patient Education  [place] Pregnancy Loss Pregnancy loss can happen any time during a pregnancy. Often the cause is not known. It is rarely because of anything you did. Pregnancy loss in early pregnancy (during the first trimester) is called a miscarriage. This type of pregnancy loss is the most common.  Pregnancy loss that happens after 20 weeks of pregnancy is called fetal demise if the baby's heart stops beating before birth. Fetal demise is much less common. Some women experience spontaneous labor shortly after fetal demise resulting in a stillborn birth (stillbirth). Any pregnancy loss can be devastating. You will need to recover both physically and emotionally. Most women are able to get pregnant again after a pregnancy loss and deliver a healthy baby. How to manage emotional recovery  Pregnancy loss is very hard emotionally. You may feel many different emotions while you grieve. You may feel sad and angry. You may also feel guilty. It is normal to have periods of crying. Emotional recovery can take longer than physical recovery. It is different for everyone. Taking these steps can help you in managing this loss:  Remember that it is unlikely you did anything to cause the pregnancy loss.  Share your thoughts and feelings with friends, family, and your partner. Remember that your partner is also recovering emotionally.  Make sure you have a good support system. Do not spend too much time alone.  Meet with a pregnancy loss counselor or join a pregnancy loss support group.  Get enough sleep and eat a  healthy diet. Return to regular exercise when you have recovered physically.  Do not use drugs or alcohol to manage your emotions.  Consider seeing a mental health professional to help you recover emotionally.  Ask a friend or loved one to help you decide what to do with any clothing and nursery items you received for your baby. In the case of a stillbirth, many women benefit from taking additional steps in the grieving process. You may want to:  Hold your baby after the birth.  Name your baby.  Request a birth certificate.  Create a keepsake such as handprints or footprints.  Dress your baby and have a picture taken.  Make funeral arrangements.  Ask for a baptism or  blessing. Hospitals have staff members who can help you with all these arrangements. How to recognize emotional stress It is normal to have emotional stress after a pregnancy loss. But emotional stress that lasts a long time or becomes severe requires treatment. Watch out for these signs of severe emotional stress:  Sadness, anger, or guilt that is not going away and is interfering with your normal activities.  Relationship problems that have occurred or gotten worse since the pregnancy loss.  Signs of depression that last longer than 2 weeks. These may include: ? Sadness. ? Anxiety. ? Hopelessness. ? Loss of interest in activities you enjoy. ? Inability to concentrate. ? Trouble sleeping or sleeping too much. ? Loss of appetite or overeating. ? Thoughts of death or of hurting yourself. Follow these instructions at home:  Take over-the-counter and prescription medicines only as told by your health care provider.  Rest at home until your energy level returns. Return to your normal activities as told by your health care provider. Ask your health care provider what activities are safe for you.  When you are ready, meet with your health care provider to discuss steps to take for a future pregnancy.  Keep all follow-up visits as told by your health care provider. This is important. Where to find support  To help you and your partner with the process of grieving, talk with your health care provider or seek counseling.  Consider meeting with others who have experienced pregnancy loss. Ask your health care provider about support groups and resources. Where to find more information  U.S. Department of Health and Programmer, systems on Women's Health: [place]  American Pregnancy Association: www.americanpregnancy.org Contact a health care provider if:  You continue to experience grief, sadness, or lack of motivation for everyday activities, and those feelings do not  improve over time.  You are struggling to recover emotionally, especially if you are using alcohol or substances to help. Get help right away if:  You have thoughts of hurting yourself or others. If you ever feel like you may hurt yourself or others, or have thoughts about taking your own life, get help right away. You can go to your nearest emergency department or call:  Your local emergency services (911 in the [place]  A suicide crisis helpline, such as the [place] at (817)514-8234. This is open 24 hours a day. Summary  Any pregnancy loss can be difficult physically and emotionally.  You may experience many different emotions while you grieve. Emotional recovery can last longer than physical recovery.  It is normal to have emotional stress after a pregnancy loss. But emotional stress that lasts a long time or becomes severe requires treatment.  See your health care provider if you are struggling emotionally  after a pregnancy loss. This information is not intended to replace advice given to you by your health care provider. Make sure you discuss any questions you have with your health care provider. Document Revised: 06/09/2018 Document Reviewed: 04/30/2017 Elsevier Patient Education  2020 [employer].         Hypokalemia Hypokalemia means that the amount of potassium in the blood is lower than normal. Potassium is a chemical (electrolyte) that helps regulate the amount of fluid in the body. It also stimulates muscle tightening (contraction) and helps nerves work properly. Normally, most of the body's potassium is inside cells, and only a very small amount is in the blood. Because the amount in the blood is so small, minor changes to potassium levels in the blood can be life-threatening. What are the causes? This condition may be caused by:  Antibiotic medicine.  Diarrhea or vomiting. Taking too much of a medicine that helps you have a bowel  movement (laxative) can cause diarrhea and lead to hypokalemia.  Chronic kidney disease (CKD).  Medicines that help the body get rid of excess fluid (diuretics).  Eating disorders, such as bulimia.  Low magnesium levels in the body.  Sweating a lot. What are the signs or symptoms? Symptoms of this condition include:  Weakness.  Constipation.  Fatigue.  Muscle cramps.  Mental confusion.  Skipped heartbeats or irregular heartbeat (palpitations).  Tingling or numbness. How is this diagnosed? This condition is diagnosed with a blood test. How is this treated? This condition may be treated by:  Taking potassium supplements by mouth.  Adjusting the medicines that you take.  Eating more foods that contain a lot of potassium. If your potassium level is very low, you may need to get potassium through an IV and be monitored in the hospital. Follow these instructions at home:   Take over-the-counter and prescription medicines only as told by your health care provider. This includes vitamins and supplements.  Eat a healthy diet. A healthy diet includes fresh fruits and vegetables, whole grains, healthy fats, and lean proteins.  If instructed, eat more foods that contain a lot of potassium. This includes: ? Nuts, such as peanuts and pistachios. ? Seeds, such as sunflower seeds and pumpkin seeds. ? Peas, lentils, and lima beans. ? Whole grain and bran cereals and breads. ? Fresh fruits and vegetables, such as apricots, avocado, bananas, cantaloupe, kiwi, oranges, tomatoes, asparagus, and potatoes. ? Orange juice. ? Tomato juice. ? Red meats. ? Yogurt.  Keep all follow-up visits as told by your health care provider. This is important. Contact a health care provider if you:  Have weakness that gets worse.  Feel your heart pounding or racing.  Vomit.  Have diarrhea.  Have diabetes (diabetes mellitus) and you have trouble keeping your blood sugar (glucose) in your  target range. Get help right away if you:  Have chest pain.  Have shortness of breath.  Have vomiting or diarrhea that lasts for more than 2 days.  Faint. Summary  Hypokalemia means that the amount of potassium in the blood is lower than normal.  This condition is diagnosed with a blood test.  Hypokalemia may be treated by taking potassium supplements, adjusting the medicines that you take, or eating more foods that are high in potassium.  If your potassium level is very low, you may need to get potassium through an IV and be monitored in the hospital. This information is not intended to replace advice given to you by your health care  provider. Make sure you discuss any questions you have with your health care provider. Document Revised: 09/30/2017 Document Reviewed: 09/30/2017 Elsevier Patient Education  2020 [employer].

## 2020-02-24 NOTE — MAU Note (Addendum)
Pt sent up from ED. In wc, sitting on chux, visible blood noted on pad.  Pt states bleeding started at 0730, gotten heavier, having cramping.  Assisted with changing clothes, clots noted in underwear, pantiliner soaked and through clothes.

## 2020-02-24 NOTE — MAU Provider Note (Signed)
History     CSN: [id number]  Arrival date and time: 02/24/20 7425   Event Date/Time   First Provider Initiated Contact with Patient 02/24/20 [phone]      Chief Complaint  Patient presents with  . Abdominal Pain  . Vaginal Bleeding   [name] is a 39 y.o. G2P1001 at [redacted]w[redacted]d who presents to MAU for vaginal bleeding which began around 730AM this morning. Patient reports intense cramping and heavy bleeding that started around that time. Patient originally presented to the ED and was transferred to MAU with blood running down her legs and soaking a chux pad. Provider to bedside immediately upon patient arrival to MAU for evaluation and exam.  Passing blood clots? yes Blood soaking clothes? yes Lightheaded/dizzy? no Significant pelvic pain or cramping? yes Passed any tissue? unsure  Allergies? NKDA Current medications? PNV  Pt denies vaginal discharge/odor/itching. Pt denies N/V, abdominal pain, constipation, diarrhea, or urinary problems. Pt denies fever, chills, fatigue, sweating or changes in appetite. Pt denies SOB or chest pain. Pt denies dizziness, HA, light-headedness, weakness.   OB History    Gravida  2   Para  1   Term  1   Preterm      AB      Living  1     SAB      IAB      Ectopic      Multiple  0   Live Births  1           Past Medical History:  Diagnosis Date  . Medical history non-contributory     Past Surgical History:  Procedure Laterality Date  . NO PAST SURGERIES      Family History  Problem Relation Age of Onset  . Cancer Paternal Grandfather   . Hypertension Maternal Grandmother   . Heart disease Maternal Grandfather   . Hypertension Father   . Hypertension Mother   . Heart disease Mother     Social History   Tobacco Use  . Smoking status: Never Smoker  . Smokeless tobacco: Never Used  Vaping Use  . Vaping Use: Never used  Substance Use Topics  . Alcohol use: Never  . Drug use: Never    Allergies: No  Known Allergies  Medications Prior to Admission  Medication Sig Dispense Refill Last Dose  . acetaminophen (TYLENOL) 500 MG tablet Take 500 mg by mouth every 6 (six) hours as needed for mild pain or headache.   02/24/2020 at Unknown time  . folic acid (FOLVITE) 400 MCG tablet Take 400 mcg by mouth daily.   02/23/2020 at Unknown time  . prenatal vitamin w/FE, FA (PRENATAL 1 + 1) 27-1 MG TABS tablet Take 1 tablet by mouth daily at 12 noon. 30 tablet 12 02/23/2020 at Unknown time  . ondansetron (ZOFRAN-ODT) 4 MG disintegrating tablet TAKE 1 TABLET BY MOUTH EVERY 8 HOURS AS NEEDED FOR NAUSEA AND VOMITING * 18 TABS/21 DAYS* 18 tablet 1   . promethazine (PHENERGAN) 25 MG tablet Take 1 tablet (25 mg total) by mouth every 6 (six) hours as needed for nausea or vomiting. 30 tablet 1     Review of Systems  Constitutional: Negative for chills, diaphoresis, fatigue and fever.  Eyes: Negative for visual disturbance.  Respiratory: Negative for shortness of breath.   Cardiovascular: Negative for chest pain.  Gastrointestinal: Positive for abdominal pain. Negative for constipation, diarrhea, nausea and vomiting.  Genitourinary: Positive for vaginal bleeding. Negative for dysuria, flank pain, frequency, pelvic pain,  urgency and vaginal discharge.  Neurological: Negative for dizziness, weakness, light-headedness and headaches.   Physical Exam   Blood pressure 106/68, pulse 75, temperature 98 F (36.7 C), temperature source Oral, resp. rate 20, height 5' 7.72" (1.72 m), weight 59 kg, last menstrual period 12/12/2019, SpO2 100 %, not currently breastfeeding.  Patient Vitals for the past 24 hrs:  BP Temp Temp src Pulse Resp SpO2 Height Weight  02/24/20 1656 106/68 98 F (36.7 C) Oral 75 -- -- -- --  02/24/20 1306 (!) 90/59 -- -- 74 -- -- -- --  02/24/20 0854 125/79 98.5 F (36.9 C) -- 86 20 -- -- --  02/24/20 0823 123/85 98.4 F (36.9 C) Oral (!) 102 18 100 % 5' 7.72" (1.72 m) 59 kg   Physical  Exam Vitals and nursing note reviewed. Exam conducted with a chaperone present.  Constitutional:      General: She is not in acute distress.    Appearance: She is well-developed and well-nourished. She is not diaphoretic.  HENT:     Head: Normocephalic and atraumatic.  Pulmonary:     Effort: Pulmonary effort is normal.  Abdominal:     General: There is no distension.     Palpations: Abdomen is soft. There is no mass.     Tenderness: There is no abdominal tenderness. There is no guarding or rebound.  Genitourinary:    Labia:        Right: No rash, tenderness or lesion.        Left: No rash, tenderness or lesion.      Vagina: Bleeding present.     Cervix: Dilated. Cervical bleeding present.     Comments: Upon initial speculum exam, bright red blood pooling in vaginal vault. Blood cleared and moderate clots with possible POCs cleared from vagina and cervical os. After clearning, pt still actively bleeding with dark red blood, but bleeding was no more than a trickle. blood loss measured after initial exam. New pad placed about 9AM. Skin:    General: Skin is warm and dry.  Neurological:     Mental Status: She is alert and oriented to person, place, and time.  Psychiatric:        Mood and Affect: Mood and affect normal.        Behavior: Behavior normal.        Thought Content: Thought content normal.        Judgment: Judgment normal.    Results for orders placed or performed during the hospital encounter of 02/24/20 (from the past 24 hour(s))  CBC     Status: Abnormal   Collection Time: 02/24/20  9:03 AM  Result Value Ref Range   WBC 12.7 (H) 4.0 - 10.5 K/uL   RBC 4.78 3.87 - 5.11 MIL/uL   Hemoglobin 13.3 12.0 - 15.0 g/dL   HCT 98.9 21.1 - 94.1 %   MCV 85.6 80.0 - 100.0 fL   MCH 27.8 26.0 - 34.0 pg   MCHC 32.5 30.0 - 36.0 g/dL   RDW 74.0 81.4 - 48.1 %   Platelets 322 150 - 400 K/uL   nRBC 0.0 0.0 - 0.2 %  Comprehensive metabolic panel     Status: Abnormal   Collection  Time: 02/24/20  9:03 AM  Result Value Ref Range   Sodium 134 (L) 135 - 145 mmol/L   Potassium 3.1 (L) 3.5 - 5.1 mmol/L   Chloride 103 98 - 111 mmol/L   CO2 20 (L) 22 - 32 mmol/L  Glucose, Bld 103 (H) 70 - 99 mg/dL   BUN 9 6 - 20 mg/dL   Creatinine, Ser 8.12 0.44 - 1.00 mg/dL   Calcium 9.1 8.9 - 75.1 mg/dL   Total Protein 7.5 6.5 - 8.1 g/dL   Albumin 4.1 3.5 - 5.0 g/dL   AST 20 15 - 41 U/L   ALT 15 0 - 44 U/L   Alkaline Phosphatase 53 38 - 126 U/L   Total Bilirubin 0.7 0.3 - 1.2 mg/dL   GFR, Estimated >70 >01 mL/min   Anion gap 11 5 - 15  hCG, quantitative, pregnancy     Status: Abnormal   Collection Time: 02/24/20  9:03 AM  Result Value Ref Range   hCG, Beta Chain, Quant, S 6,237 (H) <5 mIU/mL    US OB LESS THAN 14 WEEKS WITH OB TRANSVAGINAL  Result Date: 02/24/2020 CLINICAL DATA:  Estimated gestational age by last menstrual period equals 10 weeks 4 days. Ten weeks with bleeding. EXAM: OBSTETRIC <14 WK [place] AND TRANSVAGINAL OB TECHNIQUE: Both transabdominal and transvaginal ultrasound examinations were performed for complete evaluation of the gestation as well as the maternal uterus, adnexal regions, and pelvic cul-de-sac. Transvaginal technique was performed to assess early pregnancy. COMPARISON:  None. FINDINGS: Intrauterine gestational sac: Absent Yolk sac:  Absent Embryo:  Absent Subchorionic hemorrhage:  None visualized. Maternal uterus/adnexae: Normal ovaries.  No free fluid Echogenic material within the endometrium differential including retained products of conception versus blood clot. IMPRESSION: 1. No gestational sac, yolk sac or embryo. Findings most consistent with spontaneous abortion in progress and in patient 10 weeks 4 days estimated gestational age (LMP). 2. Echogenic material within the endometrial canal with differential including blood clot versus retained products conception. Recommend follow-up ultrasound 10-14 days to demonstrate resolution. Electronically Signed    By: Genevive Bi M.D.   On: 02/24/2020 10:08    MAU Course  Procedures  MDM -r/o ectopic, suspect SAB in process -CBC: WNL -CMP: K 3.1 -[place]: 1. No gestational sac, yolk sac or embryo. Findings most consistent with spontaneous abortion in progress and in patient 10 weeks 4 days estimated gestational age (LMP). 2. Echogenic material within the endometrial canal with differential including blood clot versus retained products conception. Recommend follow-up ultrasound 10-14 days to demonstrate resolution. -hCG: 6,237 -ABO: A Positive -consulted with Dr. Donavan Foil who agrees with SAB in process given clinical picture and agrees with use of cytotec -prior to administration of Cytotec, pad check performed after 2 hours, minimal spotting present on pad Cytotec Protocol X  No serious current illness  X  Baseline Hgb greater than or equal to 10g/dl (74.9) X  Patient has easily accessible transportation to the hospital  X  Clear preference  X  Practitioner/physician deems patient reliable  X  Counseling by practitioner or physician  X  Patient education by RN  X   Rho-Gam given by RN if indicated (n/a A Positive) X  Medication dispensed  X  Cytotec 800 mcg   X   Rectally by RN in MAU  X   Ibuprofen 600 mg 1 tablet by mouth every 6 hours as needed - prescribed  X   Tylenol 650mg  every 6 hours as needed - prescribed  Reviewed with pt cytotec procedure.  Pt verbalizes that she lives close to the hospital and has transportation readily available.  Pt appears reliable and verbalizes understanding and agrees with plan of care.  -cytotec given and patient bleeding reevaluated about 1.5 hours later, moderate bleeding noted  on pad, so speculum exam reperformed -on repeat speculum exam, POCs protruding from about 1/3 of the way from cervical os, pt requesting additional pain medication, Percocet given, POCs unable to be removed from cervical os -RN attempted repositioning, sitting on toilet with patient,  no POCs -called Dr. Donavan FoilBass to bedside for assistance, who removed POCs easily, POCs sent to pathology. Per Dr. Donavan FoilBass, pt should be prescribed methergine 0.2mg   q8hrs for 6 doses and doxycycline 100mg  BID x5 days -pt discharged to home in stable condition  Orders Placed This Encounter  Procedures  . US OB LESS THAN 14 WEEKS WITH OB TRANSVAGINAL    Needed bedside, Room 30.    Standing Status:   Standing    Number of Occurrences:   1    Order Specific Question:   Symptom/Reason for Exam    Answer:   Vaginal bleeding in pregnancy [705036]  . CBC    Standing Status:   Standing    Number of Occurrences:   1  . Comprehensive metabolic panel    Standing Status:   Standing    Number of Occurrences:   1  . hCG, quantitative, pregnancy    Standing Status:   Standing    Number of Occurrences:   1  . Discharge patient    Order Specific Question:   Discharge disposition    Answer:   01-Home or Self Care [1]    Order Specific Question:   Discharge patient date    Answer:   02/24/2020   Meds ordered this encounter  Medications  . lactated ringers infusion  . ketorolac (TORADOL) 30 MG/ML injection 30 mg  . misoprostol (CYTOTEC) tablet 800 mcg  . oxyCODONE-acetaminophen (PERCOCET/ROXICET) 5-325 MG per tablet 1 tablet  . methylergonovine (METHERGINE) tablet 0.2 mg  . methylergonovine (METHERGINE) 0.2 MG tablet    Sig: Take 1 tablet (0.2 mg total) by mouth every 8 (eight) hours.    Dispense:  5 tablet    Refill:  0    Order Specific Question:   Supervising Provider    Answer:   Mariel AloeBASS, LAWRENCE A [[id number]]  . doxycycline (VIBRAMYCIN) 100 MG capsule    Sig: Take 1 capsule (100 mg total) by mouth 2 (two) times daily for 5 days.    Dispense:  10 capsule    Refill:  0    Order Specific Question:   Supervising Provider    Answer:   Mariel AloeBASS, LAWRENCE A [[id number]]  . ibuprofen (ADVIL) 600 MG tablet    Sig: Take 1 tablet (600 mg total) by mouth every 6 (six) hours as needed for up to 30 doses for moderate  pain or cramping.    Dispense:  30 tablet    Refill:  0    Order Specific Question:   Supervising Provider    Answer:   Mariel AloeBASS, LAWRENCE A [[id number]]  . acetaminophen (TYLENOL) 325 MG tablet    Sig: Take 2 tablets (650 mg total) by mouth every 6 (six) hours as needed for moderate pain.    Dispense:  30 tablet    Refill:  0    Order Specific Question:   Supervising Provider    Answer:   Mariel AloeBASS, LAWRENCE A [[id number]]  . potassium chloride (KLOR-CON) 10 MEQ tablet    Sig: Take 1 tablet (10 mEq total) by mouth daily.    Dispense:  5 tablet    Refill:  0    Order Specific Question:   Supervising Provider    Answer:  Mariel Aloe A [[id number]]   Assessment and Plan   1. Miscarriage   2. Vaginal bleeding in pregnancy   3. Blood type, Rh positive   4. Hypokalemia     Allergies as of 02/24/2020   No Known Allergies     Medication List    STOP taking these medications   folic acid 400 MCG tablet Commonly known as: FOLVITE   ondansetron 4 MG disintegrating tablet Commonly known as: ZOFRAN-ODT   prenatal vitamin w/FE, FA 27-1 MG Tabs tablet   promethazine 25 MG tablet Commonly known as: PHENERGAN     TAKE these medications   acetaminophen 325 MG tablet Commonly known as: TYLENOL Take 2 tablets (650 mg total) by mouth every 6 (six) hours as needed for moderate pain. What changed:   medication strength  how much to take  reasons to take this   doxycycline 100 MG capsule Commonly known as: VIBRAMYCIN Take 1 capsule (100 mg total) by mouth 2 (two) times daily for 5 days.   ibuprofen 600 MG tablet Commonly known as: ADVIL Take 1 tablet (600 mg total) by mouth every 6 (six) hours as needed for up to 30 doses for moderate pain or cramping.   methylergonovine 0.2 MG tablet Commonly known as: METHERGINE Take 1 tablet (0.2 mg total) by mouth every 8 (eight) hours.   potassium chloride 10 MEQ tablet Commonly known as: KLOR-CON Take 1 tablet (10 mEq total) by mouth daily.        -RX KDur -RX Ibuprofen -RX Tylenol -message sent to [facility] to schedule SAB f/u in one week -return MAU precautions given -pt discharged to home in stable condition  Joni Reining E Jaquann Guarisco 02/24/2020, 5:16 PM

## 2020-02-28 LAB — SURGICAL PATHOLOGY

## 2020-03-03 NOTE — L&D Delivery Note (Signed)
Delivery Note:   G3P1011 at [redacted]w[redacted]d  Admitting diagnosis: Supervision of other normal pregnancy, antepartum [Z34.80] Risks: N/A Onset of labor: 0830 IOL/Augmentation: AROM and Pitocin ROM: 12/6 @1052   Complete dilation at 02/05/2021  1612 Onset of pushing at 1614 FHR second stage 125  Analgesia /Anesthesia intrapartum:Epidural  Pushing in right tilt position with CNM and L&D staff support, husband 14/08/2020 present for birth and supportive.  Delivery of a Live born female  Birth Weight: 3430g APGAR: 8, 9  Newborn Delivery   Birth date/time: 02/05/2021 16:31:00 Delivery type: Vaginal, Spontaneous      in cephalic presentation, position LOA. Gentle traction applied and anterior shoulder delivered with ease. Baby delivered and placed onto mom's chest.   APGAR:1 min-8 , 5 min-9   Nuchal Cord: No  Cord double clamped after cessation of pulsation, cut by patient.  Collection of cord blood for typing completed. Cord blood donation-None  Arterial cord blood sample-No    Placenta delivered-Spontaneous  with 3 vessels . Uterotonics: IV Pitocin Placenta to L&D Uterine tone firm  Bleeding light    laceration identified.  Episiotomy:None  Local analgesia: N/A  Repair: N/A Est. Blood Loss (mL):0.00   Complications: None  Mom to postpartum.  Baby Yannis to Couplet care / Skin to Skin.  Delivery Report:  Review the Delivery Report for details.    14/08/2020, SNM 02/05/2021, 4:44 PM

## 2020-03-06 ENCOUNTER — Ambulatory Visit (INDEPENDENT_AMBULATORY_CARE_PROVIDER_SITE_OTHER): Payer: 59

## 2020-03-06 ENCOUNTER — Other Ambulatory Visit: Payer: Self-pay

## 2020-03-06 ENCOUNTER — Other Ambulatory Visit: Payer: Medicaid Other

## 2020-03-06 ENCOUNTER — Encounter: Payer: Self-pay | Admitting: Obstetrics & Gynecology

## 2020-03-06 VITALS — BP 103/69 | HR 82 | Ht 67.0 in | Wt 144.0 lb

## 2020-03-06 DIAGNOSIS — Z789 Other specified health status: Secondary | ICD-10-CM

## 2020-03-06 DIAGNOSIS — O039 Complete or unspecified spontaneous abortion without complication: Secondary | ICD-10-CM | POA: Diagnosis not present

## 2020-03-06 NOTE — Patient Instructions (Signed)

## 2020-03-06 NOTE — Progress Notes (Addendum)
GYN presents for SAB FU.  C/o pain whenever she lifts child and light bleeding.  EPDS=7

## 2020-03-06 NOTE — Progress Notes (Signed)
GYNECOLOGY PROBLEM OFFICE VISIT NOTE  History:  Alyssa Hodge is a 40 y.o. G2P1001 here today for follow up s/p SAB. She reports some vaginal bleeding and expresses concern about pain associated with lifting. Patient reports the pain is located in her lower abdominal area with lifting heavy items and resolves after a few minutes after rest.  Patient denies any abnormal vaginal discharge or other concerns.  Patient accompanied by her husband and young son.  Patient expresses desire for pregnancy.   Patient denies pain or difficulty with urination.  No diarrhea or constipation.  Patient is not taking prenatal vitamin.   Past Medical History:  Diagnosis Date  . Medical history non-contributory     Past Surgical History:  Procedure Laterality Date  . NO PAST SURGERIES      The following portions of the patient's history were reviewed and updated as appropriate: allergies, current medications, past family history, past medical history, past social history, past surgical history and problem list.   Health Maintenance:  Abnormal pap and negative HRHPV on Positive.  No Mammogram history.   Review of Systems:  Genito-Urinary ROS: no dysuria, trouble voiding, or hematuria Gastrointestinal ROS: negative Objective:  Vitals: BP 103/69   Pulse 82   Ht 5\' 7"  (1.702 m)   Wt 144 lb (65.3 kg)   LMP 12/12/2019   Breastfeeding Unknown   BMI 22.55 kg/m   Physical Exam: Physical Exam Constitutional:      Appearance: Normal appearance.  HENT:     Head: Normocephalic and atraumatic.  Eyes:     Conjunctiva/sclera: Conjunctivae normal.  Cardiovascular:     Rate and Rhythm: Normal rate and regular rhythm.  Pulmonary:     Effort: Pulmonary effort is normal.     Breath sounds: Normal breath sounds.  Abdominal:     General: Bowel sounds are normal.     Palpations: Abdomen is soft.     Tenderness: There is abdominal tenderness in the left lower quadrant.  Musculoskeletal:        General:  Normal range of motion.     Cervical back: Normal range of motion.  Neurological:     Mental Status: She is alert and oriented to person, place, and time.  Skin:    General: Skin is warm and dry.  Psychiatric:        Mood and Affect: Mood normal.        Behavior: Behavior normal.        Thought Content: Thought content normal.      Labs and Imaging: 02/11/2020 OB LESS THAN 14 WEEKS WITH OB TRANSVAGINAL  Result Date: 02/24/2020 CLINICAL DATA:  Estimated gestational age by last menstrual period equals 10 weeks 4 days. Ten weeks with bleeding. EXAM: OBSTETRIC <14 WK 02/26/2020 AND TRANSVAGINAL OB TECHNIQUE: Both transabdominal and transvaginal ultrasound examinations were performed for complete evaluation of the gestation as well as the maternal uterus, adnexal regions, and pelvic cul-de-sac. Transvaginal technique was performed to assess early pregnancy. COMPARISON:  None. FINDINGS: Intrauterine gestational sac: Absent Yolk sac:  Absent Embryo:  Absent Subchorionic hemorrhage:  None visualized. Maternal uterus/adnexae: Normal ovaries.  No free fluid Echogenic material within the endometrium differential including retained products of conception versus blood clot. IMPRESSION: 1. No gestational sac, yolk sac or embryo. Findings most consistent with spontaneous abortion in progress and in patient 10 weeks 4 days estimated gestational age (LMP). 2. Echogenic material within the endometrial canal with differential including blood clot versus retained products conception. Recommend follow-up  ultrasound 10-14 days to demonstrate resolution. Electronically Signed   By: Genevive Bi M.D.   On: 02/24/2020 10:08    Assessment & Plan:  40 year old 1. Spontaneous abortion   2. Language barrier     -Condolences given.  -Reviewed chart and recommendations for follow up. -Discussed that hCG would be collected today and advised waiting until level is <5 before attempting to conceive. -Discussed how uterus did  experience recent pregnancy and that she should take precautions/limitations as needed. -Informed of recommendation for f/u US to assure complete expulsion of all POC. -Patient and husband state they are unavailable to do so today.  Order present and encouraged to schedule by end of the week if possible. -Plan to RTO prn or in 6 months if no pregnancy has yet occurred.  -Interpretations completed, when necessary, by husband.     Total face-to-face time with patient: 15 minutes   Gerrit Heck, CNM 03/06/2020 10:09 AM

## 2020-03-07 LAB — BETA HCG QUANT (REF LAB): hCG Quant: 56 m[IU]/mL

## 2020-03-09 ENCOUNTER — Telehealth: Payer: Self-pay

## 2020-03-09 NOTE — Telephone Encounter (Signed)
TC to patient concerning test results- no answer or voice mail to leave a message.

## 2020-03-09 NOTE — Telephone Encounter (Signed)
-----   Message from Gerrit Heck, [place] sent at 03/09/2020  8:39 AM EST ----- Patient needs repeat quant in 2 weeks. Thanks

## 2020-03-15 ENCOUNTER — Other Ambulatory Visit: Payer: Self-pay

## 2020-03-20 ENCOUNTER — Ambulatory Visit: Payer: 59 | Admitting: Nurse Practitioner

## 2020-03-21 ENCOUNTER — Other Ambulatory Visit: Payer: Self-pay | Admitting: Adult Health

## 2020-03-21 DIAGNOSIS — O039 Complete or unspecified spontaneous abortion without complication: Secondary | ICD-10-CM

## 2020-03-21 LAB — BETA HCG QUANT (REF LAB): hCG Quant: 9 m[IU]/mL

## 2020-04-30 ENCOUNTER — Telehealth (INDEPENDENT_AMBULATORY_CARE_PROVIDER_SITE_OTHER): Payer: Self-pay

## 2020-04-30 NOTE — Telephone Encounter (Signed)
Alyssa Hodge called back and scheduled patient for an appointment on 05/22/2020 at 11:30am. Alyssa Hodge verbalized an understanding.

## 2020-04-30 NOTE — Telephone Encounter (Signed)
Patients husband, Thornell Mule, called and requested a suggestion for an eye specialist for his wife, this patient. Godfrey Pick stated that his wife saw you and may be in the old system. Please advise and I will call him back at [phone].

## 2020-04-30 NOTE — Telephone Encounter (Signed)
She can try [facility]-if she needs a referral,we will need to see her.

## 2020-04-30 NOTE — Telephone Encounter (Signed)
Eye specialist in the [place] area please.

## 2020-04-30 NOTE — Telephone Encounter (Signed)
Called number provided and not able to leave a voice message. I will try back at another time.

## 2020-05-15 ENCOUNTER — Ambulatory Visit (INDEPENDENT_AMBULATORY_CARE_PROVIDER_SITE_OTHER): Payer: 59 | Admitting: Internal Medicine

## 2020-05-22 ENCOUNTER — Encounter (INDEPENDENT_AMBULATORY_CARE_PROVIDER_SITE_OTHER): Payer: Self-pay | Admitting: Internal Medicine

## 2020-05-22 ENCOUNTER — Ambulatory Visit (INDEPENDENT_AMBULATORY_CARE_PROVIDER_SITE_OTHER): Payer: 59 | Admitting: Internal Medicine

## 2020-05-22 ENCOUNTER — Other Ambulatory Visit: Payer: Self-pay

## 2020-05-22 VITALS — BP 100/70 | HR 78 | Temp 97.9°F | Resp 18 | Ht 67.0 in | Wt 150.0 lb

## 2020-05-22 DIAGNOSIS — R5383 Other fatigue: Secondary | ICD-10-CM

## 2020-05-22 DIAGNOSIS — E559 Vitamin D deficiency, unspecified: Secondary | ICD-10-CM | POA: Diagnosis not present

## 2020-05-22 DIAGNOSIS — E876 Hypokalemia: Secondary | ICD-10-CM

## 2020-05-22 DIAGNOSIS — H18603 Keratoconus, unspecified, bilateral: Secondary | ICD-10-CM

## 2020-05-22 DIAGNOSIS — R5381 Other malaise: Secondary | ICD-10-CM

## 2020-05-22 NOTE — Progress Notes (Signed)
Metrics: Intervention Frequency ACO  Documented Smoking Status Yearly  Screened one or more times in 24 months  Cessation Counseling or  Active cessation medication Past 24 months  Past 24 months   Guideline developer: UpToDate (See UpToDate for funding source) Date Released: 2014       Wellness Office Visit  Subjective:  Patient ID: Alyssa Hodge, female    DOB: [birth year]  Age: 40 y.o. MRN: [id number]  CC: This lady comes in for follow-up after long hiatus. HPI She apparently recently had a miscarriage.  Blood work at that time showed hypokalemia and elevated white blood cell count. In the past, she has had vitamin D deficiency. She would like to be referred to an ophthalmologist because of her keratoconus which was diagnosed in [place].  Past Medical History:  Diagnosis Date  . Medical history non-contributory    Past Surgical History:  Procedure Laterality Date  . NO PAST SURGERIES       Family History  Problem Relation Age of Onset  . Cancer Paternal Grandfather   . Hypertension Maternal Grandmother   . Heart disease Maternal Grandfather   . Hypertension Father   . Hypertension Mother   . Heart disease Mother     Social History   Social History Narrative  . Not on file   Social History   Tobacco Use  . Smoking status: Never Smoker  . Smokeless tobacco: Never Used  Substance Use Topics  . Alcohol use: Never    No outpatient medications have been marked as taking for the 05/22/20 encounter (Office Visit) with Wilson Singer, MD.     Flowsheet Row Initial Prenatal from 06/15/2018 in [facility]  PHQ-9 Total Score 7      Objective:   Today's Vitals: BP 100/70 (BP Location: Left Arm, Patient Position: Sitting, Cuff Size: Small)   Pulse 78   Temp 97.9 F (36.6 C) (Temporal)   Resp 18   Ht 5\' 7"  (1.702 m)   Wt 150 lb (68 kg)   LMP 12/12/2019   SpO2 99%   BMI 23.49 kg/m  Vitals with BMI 05/22/2020 03/06/2020 02/24/2020  Height 5\' 7"  5'  7" -  Weight 150 lbs 144 lbs -  BMI 23.49 22.55 -  Systolic 100 103 02/26/2020  Diastolic 70 69 68  Pulse 78 82 75     Physical Exam  She looks systemically well.  Excellent blood pressure.  Healthy weight.     Assessment   1. Vitamin D deficiency disease   2. Hypokalemia   3. Malaise and fatigue   4. Keratoconus of both eyes       Tests ordered Orders Placed This Encounter  Procedures  . CBC  . COMPLETE METABOLIC PANEL WITH GFR  . T3, free  . T4, free  . TSH  . Ambulatory referral to Ophthalmology     Plan: 1. We will check some blood work today. 2. I will refer to ophthalmology for keratoconus. 3. She will see in about 6 months for an annual physical exam.   No orders of the defined types were placed in this encounter.   409, MD

## 2020-05-23 LAB — COMPLETE METABOLIC PANEL WITH GFR
AG Ratio: 1.3 (calc) (ref 1.0–2.5)
ALT: 7 U/L (ref 6–29)
AST: 13 U/L (ref 10–30)
Albumin: 4.2 g/dL (ref 3.6–5.1)
Alkaline phosphatase (APISO): 66 U/L (ref 31–125)
BUN: 15 mg/dL (ref 7–25)
CO2: 24 mmol/L (ref 20–32)
Calcium: 9.2 mg/dL (ref 8.6–10.2)
Chloride: 105 mmol/L (ref 98–110)
Creat: 0.62 mg/dL (ref 0.50–1.10)
GFR, Est African American: 134 mL/min/{1.73_m2} (ref >=60)
GFR, Est Non African American: 115 mL/min/{1.73_m2} (ref >=60)
Globulin: 3.2 g/dL (calc) (ref 1.9–3.7)
Glucose, Bld: 63 mg/dL — ABNORMAL LOW (ref 65–139)
Potassium: 4 mmol/L (ref 3.5–5.3)
Sodium: 138 mmol/L (ref 135–146)
Total Bilirubin: 0.4 mg/dL (ref 0.2–1.2)
Total Protein: 7.4 g/dL (ref 6.1–8.1)

## 2020-05-23 LAB — CBC
HCT: 39.5 % (ref 35.0–45.0)
Hemoglobin: 12.5 g/dL (ref 11.7–15.5)
MCH: 25.4 pg — ABNORMAL LOW (ref 27.0–33.0)
MCHC: 31.6 g/dL — ABNORMAL LOW (ref 32.0–36.0)
MCV: 80.3 fL (ref 80.0–100.0)
MPV: 9.8 fL (ref 7.5–12.5)
Platelets: 384 10*3/uL (ref 140–400)
RBC: 4.92 10*6/uL (ref 3.80–5.10)
RDW: 13.8 % (ref 11.0–15.0)
WBC: 8.9 10*3/uL (ref 3.8–10.8)

## 2020-05-23 LAB — TSH: TSH: 5.01 mIU/L — ABNORMAL HIGH

## 2020-05-23 LAB — T3, FREE: T3, Free: 3.6 pg/mL (ref 2.3–4.2)

## 2020-05-23 LAB — T4, FREE: Free T4: 1.1 ng/dL (ref 0.8–1.8)

## 2020-06-12 ENCOUNTER — Other Ambulatory Visit: Payer: Self-pay | Admitting: Women's Health

## 2020-06-12 ENCOUNTER — Telehealth: Payer: Self-pay | Admitting: Women's Health

## 2020-06-12 MED ORDER — DOXYLAMINE-PYRIDOXINE 10-10 MG PO TBEC: DELAYED_RELEASE_TABLET | ORAL | 6 refills | Status: DC

## 2020-06-12 NOTE — Telephone Encounter (Signed)
Pt's husband called yesterday (sorry, got crazy & forgot to send back message)  He states pt is pregnant again & requesting ondansetron (ZOFRAN ODT) 4 MG disintegrating tablet  Please send to [facility]  Pregnancy confirmation scheduled for 4/19 here with nurse   Please advise & notify pt

## 2020-06-12 NOTE — Telephone Encounter (Signed)
Called pt to ask about nausea meds, no answer, no vm setup.

## 2020-06-15 IMAGING — US US OB TRANSVAGINAL
1 series · 13 of 28 positions shown · non-contrast
Comparison: None.

CLINICAL DATA: Abdominal and pelvic pain for 1 day. Estimated
gestational age by LMP is 8 weeks 6 days. Quantitative beta HCG is
not available.

EXAM:
OBSTETRIC <14 WK US AND TRANSVAGINAL OB US
TECHNIQUE: Both transabdominal and transvaginal ultrasound examinations were
performed for complete evaluation of the gestation as well as the
maternal uterus, adnexal regions, and pelvic cul-de-sac.
Transvaginal technique was performed to assess early pregnancy.

[Series 1: us ob transvaginal · 13 of 37 slices shown]
[im 2/37]
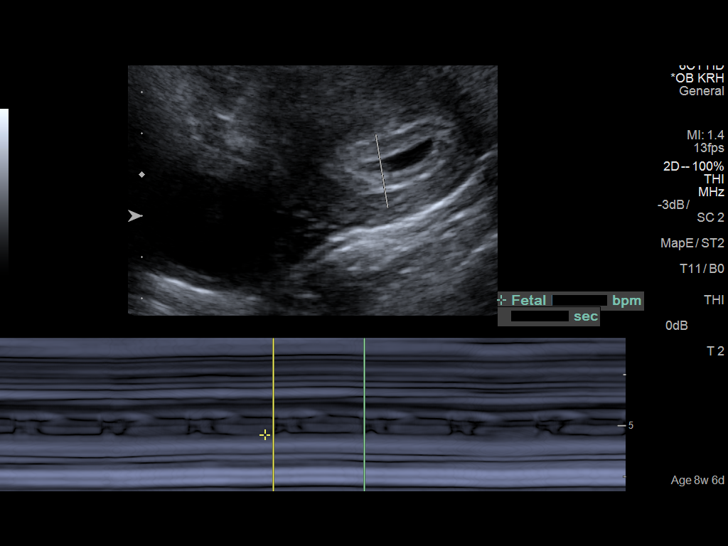
[im 5/37]
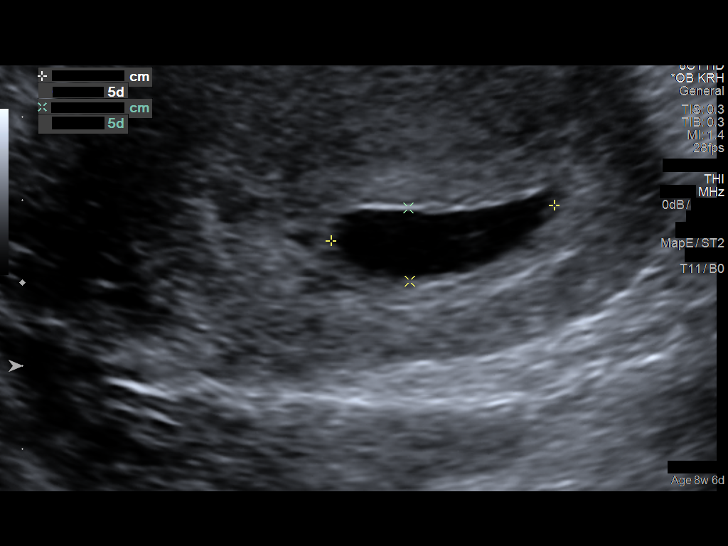
[im 7/37]
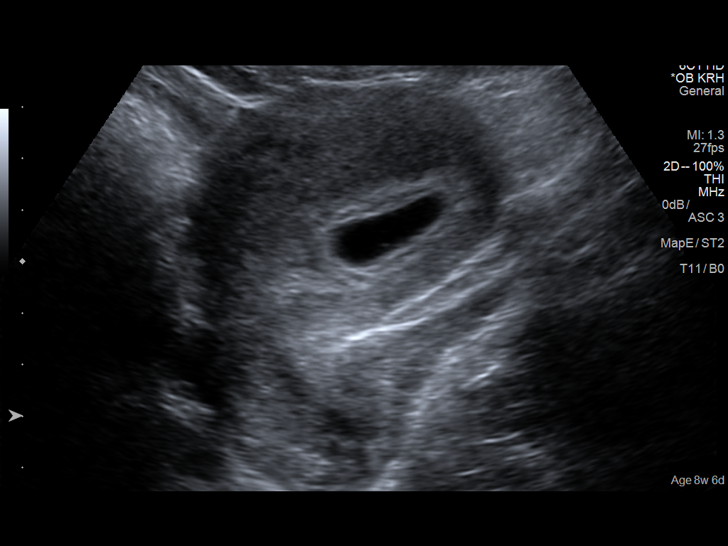
[im 10/37]
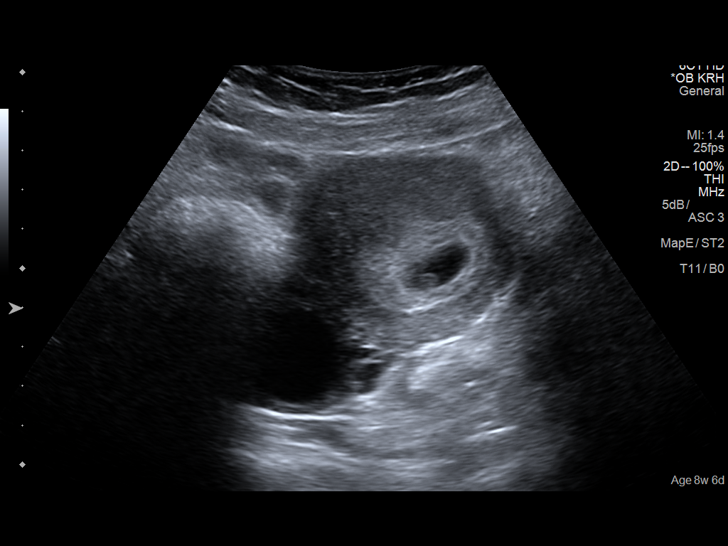
[im 13/37]
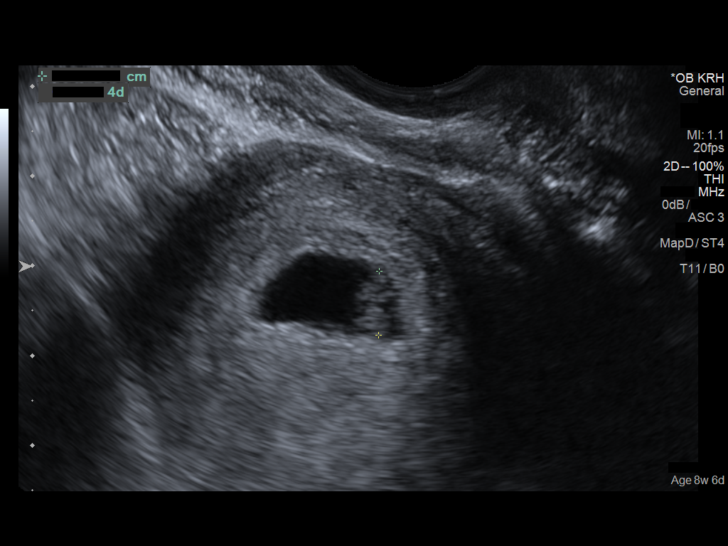
[im 15/37]
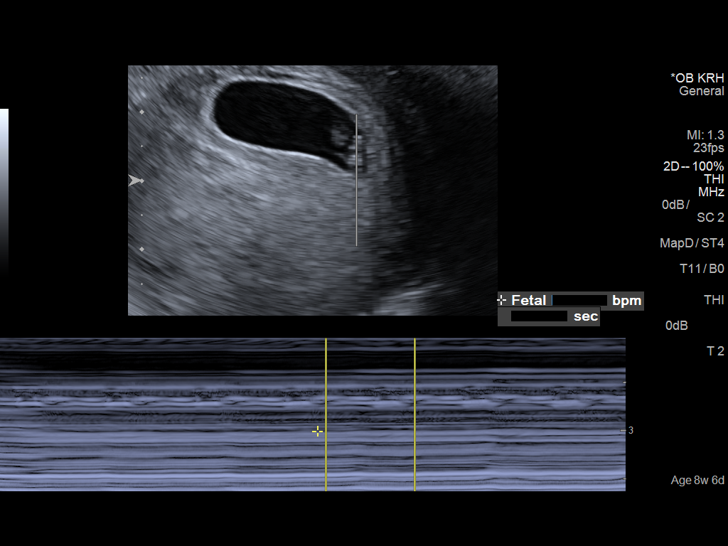
[im 19/37]
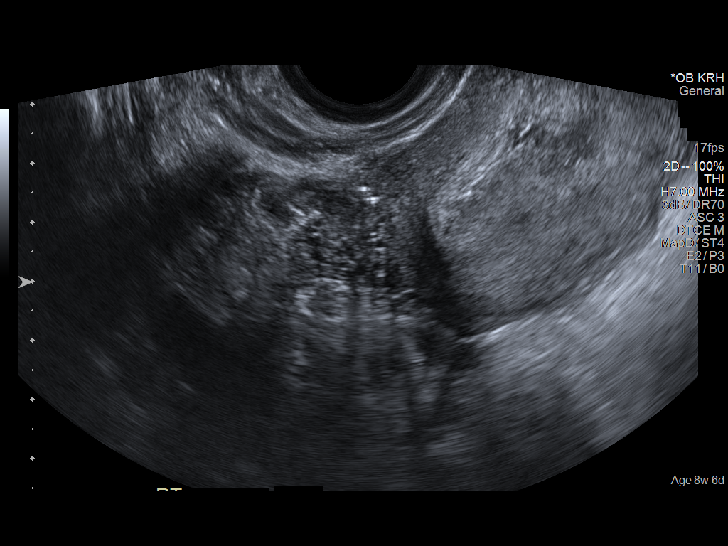
[im 22/37]
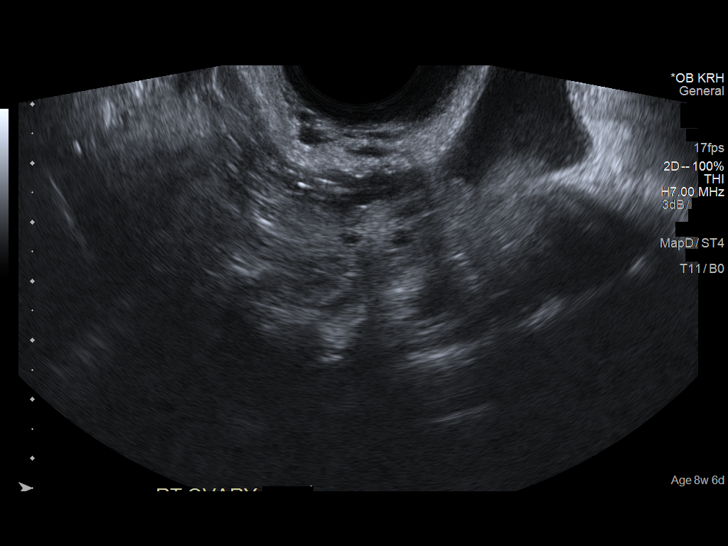
[im 25/37]
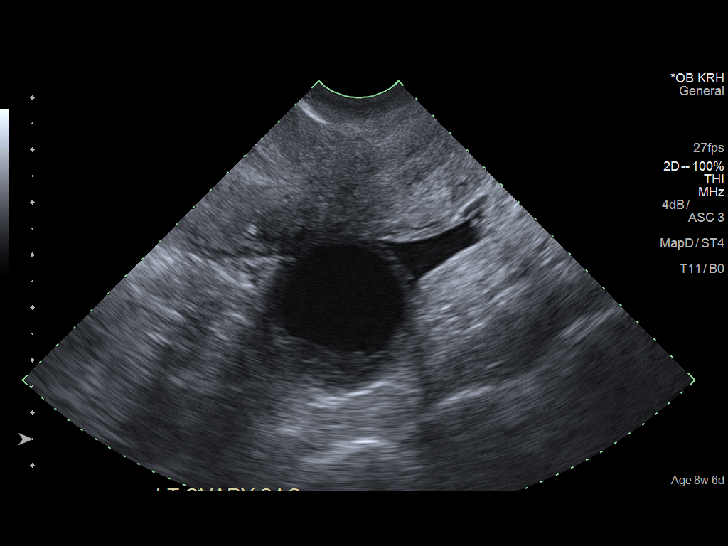
[im 27/37]
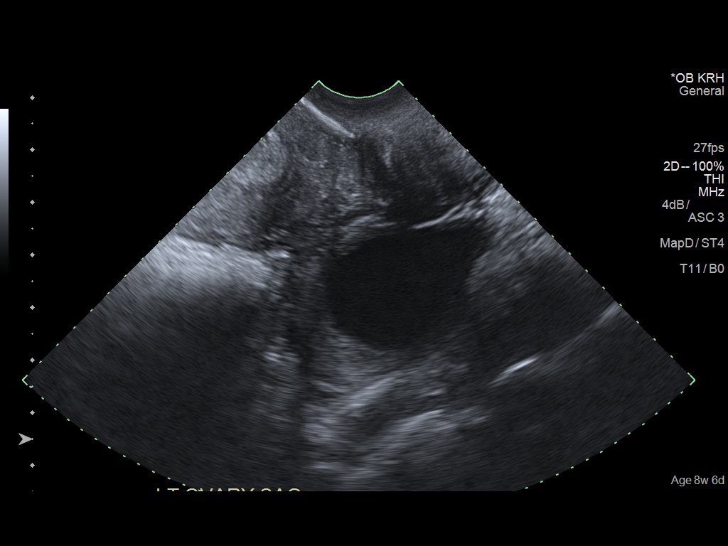
[im 30/37]
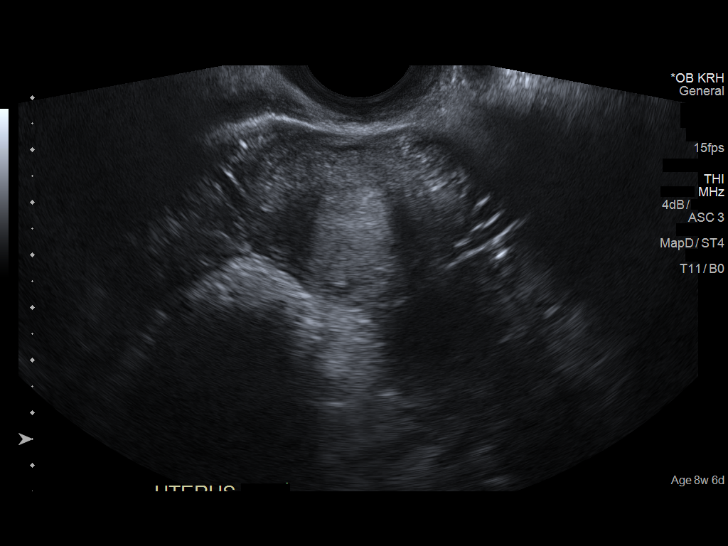
[im 33/37]
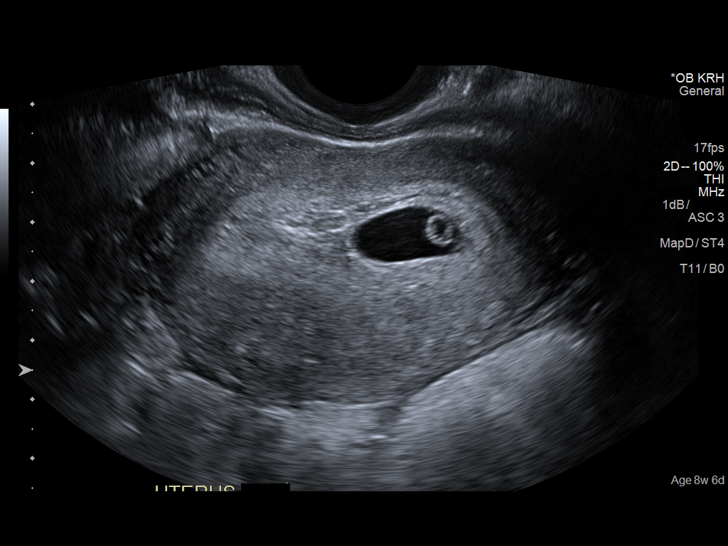
[im 35/37]
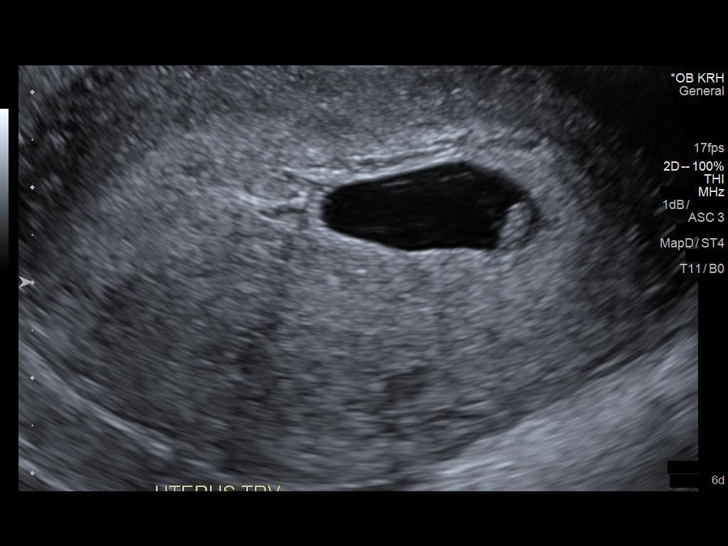

[13 of 28 positions shown; findings below may reference images not displayed]

FINDINGS: Intrauterine gestational sac: A single intrauterine gestational sac
is present.

Yolk sac:  Yolk sac is present.

Embryo:  Fetal pole is identified.

Cardiac Activity: Fetal cardiac activity is observed.

Heart Rate: 115 bpm

CRL:  7.2 mm   6 w   4 d                  US EDC: 12/18/2018

Subchorionic hemorrhage:  None visualized.

Maternal uterus/adnexae: Uterus is anteverted. No myometrial mass
lesions identified. Both ovaries are visualized and appear normal.
No abnormal adnexal masses. Corpus luteal cyst on the left. Small
amount of free fluid in the pelvis adjacent to the left ovary.
IMPRESSION: Single intrauterine pregnancy. Estimated gestational age by
crown-rump length is 6 weeks 4 days. No acute complication is
suggested sonographically.

## 2020-06-19 ENCOUNTER — Other Ambulatory Visit: Payer: Self-pay

## 2020-06-19 ENCOUNTER — Ambulatory Visit (INDEPENDENT_AMBULATORY_CARE_PROVIDER_SITE_OTHER): Payer: 59 | Admitting: *Deleted

## 2020-06-19 VITALS — BP 107/74 | HR 80 | Ht 67.0 in | Wt 143.0 lb

## 2020-06-19 DIAGNOSIS — O209 Hemorrhage in early pregnancy, unspecified: Secondary | ICD-10-CM

## 2020-06-19 DIAGNOSIS — Z3201 Encounter for pregnancy test, result positive: Secondary | ICD-10-CM

## 2020-06-19 LAB — POCT URINE PREGNANCY: Preg Test, Ur: POSITIVE — AB

## 2020-06-19 NOTE — Progress Notes (Signed)
Chart reviewed for nurse visit. Agree with plan of care.  Adline Potter, NP 06/19/2020 12:00 PM

## 2020-06-19 NOTE — Progress Notes (Signed)
   NURSE VISIT- PREGNANCY CONFIRMATION   SUBJECTIVE:  Alyssa Hodge is a 40 y.o. G54P1011 female at [facility] by certain LMP of Patient's last menstrual period was 05/06/2020 (exact date). Here for pregnancy confirmation.  Home pregnancy test: positive x 2  She reports cramping and spotting for the last week with the heaviest bleeding last Tuesday.  She is taking prenatal vitamins.    OBJECTIVE:  BP 107/74 (BP Location: Right Arm, Patient Position: Sitting, Cuff Size: Normal)   Pulse 80   Ht 5\' 7"  (1.702 m)   Wt 143 lb (64.9 kg)   LMP 05/06/2020 (Exact Date)   Breastfeeding No   BMI 22.40 kg/m   Appears well, in no apparent distress OB History  Gravida Para Term Preterm AB Living  4 1 1   1 1   SAB IAB Ectopic Multiple Live Births  1     0 1    # Outcome Date GA Lbr Len/2nd Weight Sex Delivery Anes PTL Lv  4 Current           3 Term 12/22/18 [redacted]w[redacted]d / 02:23 6 lb 8.1 oz (2.951 kg) M Vag-Spont EPI  LIV  2 Gravida           1 SAB             Results for orders placed or performed in visit on 06/19/20 (from the past 24 hour(s))  POCT urine pregnancy   Collection Time: 06/19/20 10:22 AM  Result Value Ref Range   Preg Test, Ur Positive (A) Negative    ASSESSMENT: Positive Pregnancy Test now [redacted]w[redacted]d by LMP    PLAN: Schedule for dating ultrasound in TBD Prenatal vitamins: continue   Nausea medicines: not currently needed   OB packet given: No  06/21/20  06/19/2020 11:33 AM

## 2020-06-20 ENCOUNTER — Other Ambulatory Visit: Payer: Self-pay

## 2020-06-20 ENCOUNTER — Telehealth: Payer: Self-pay

## 2020-06-20 LAB — BETA HCG QUANT (REF LAB): hCG Quant: 79962 m[IU]/mL

## 2020-06-20 NOTE — Telephone Encounter (Signed)
Will go to [facility] in [place] as that is closer to their home. Orders placed.

## 2020-06-20 NOTE — Telephone Encounter (Signed)
Pt's husband called and stated he was returning a call from this AM, he also stated that she was still bleeding she woke up around 12pm and still had blood

## 2020-06-22 ENCOUNTER — Other Ambulatory Visit: Payer: Self-pay

## 2020-06-22 ENCOUNTER — Ambulatory Visit (INDEPENDENT_AMBULATORY_CARE_PROVIDER_SITE_OTHER): Payer: 59 | Admitting: Obstetrics & Gynecology

## 2020-06-22 ENCOUNTER — Telehealth: Payer: Self-pay | Admitting: Obstetrics & Gynecology

## 2020-06-22 ENCOUNTER — Encounter: Payer: Self-pay | Admitting: Obstetrics & Gynecology

## 2020-06-22 VITALS — BP 98/63 | HR 80 | Ht 67.0 in | Wt 136.0 lb

## 2020-06-22 DIAGNOSIS — O219 Vomiting of pregnancy, unspecified: Secondary | ICD-10-CM | POA: Diagnosis not present

## 2020-06-22 DIAGNOSIS — O418X10 Other specified disorders of amniotic fluid and membranes, first trimester, not applicable or unspecified: Secondary | ICD-10-CM

## 2020-06-22 DIAGNOSIS — O468X1 Other antepartum hemorrhage, first trimester: Secondary | ICD-10-CM | POA: Diagnosis not present

## 2020-06-22 LAB — BETA HCG QUANT (REF LAB): hCG Quant: 91320 m[IU]/mL

## 2020-06-22 MED ORDER — OMEPRAZOLE 20 MG PO CPDR: 20.0000 mg | DELAYED_RELEASE_CAPSULE | Freq: Every day | ORAL | 6 refills | Status: DC

## 2020-06-22 MED ORDER — ONDANSETRON 8 MG PO TBDP: 8.0000 mg | ORAL_TABLET | Freq: Three times a day (TID) | ORAL | 6 refills | Status: DC | PRN

## 2020-06-22 NOTE — Telephone Encounter (Signed)
Spoke to husband.  Advised that Dr Despina Hidden recommended she be seen this morning.  States he is at work and will need to go pick her up and should be here by 11:30.

## 2020-06-22 NOTE — Progress Notes (Signed)
Chief Complaint  Patient presents with  . work-in bleeding    6+[redacted] wks pregnant      40 y.o. G4P1011 Patient's last menstrual period was 05/06/2020 (exact date). The current method of family planning is pregnant.  Outpatient Encounter Medications as of 06/22/2020  Medication Sig  . Doxylamine-Pyridoxine (DICLEGIS) 10-10 MG TBEC 2 tabs q hs, if sx persist add 1 tab q am on day 3, if sx persist add 1 tab q afternoon on day 4 (Patient not taking: Reported on 06/22/2020)  . omeprazole (PRILOSEC) 20 MG capsule Take 1 capsule (20 mg total) by mouth daily. 1 tablet a day  . ondansetron (ZOFRAN ODT) 8 MG disintegrating tablet Take 1 tablet (8 mg total) by mouth every 8 (eight) hours as needed for nausea or vomiting.  . ondansetron (ZOFRAN) 4 MG tablet Take 4 mg by mouth every 8 (eight) hours as needed for nausea or vomiting.   No facility-administered encounter medications on file as of 06/22/2020.    Subjective Pt was seen  for serial HCG due to bleeding in early pregnancy  HCGs were done and certainly at a level a pregnancy should be seen on sonogram  She was brought in and sonogram report is below  I did a POC sonogram and documented with a photo of one of the images Past Medical History:  Diagnosis Date  . Medical history non-contributory     Past Surgical History:  Procedure Laterality Date  . NO PAST SURGERIES      OB History    Gravida  4   Para  1   Term  1   Preterm      AB  1   Living  1     SAB  1   IAB      Ectopic      Multiple  0   Live Births  1           No Known Allergies  Social History   Socioeconomic History  . Marital status: Married    Spouse name: Thornell Mule  . Number of children: 1  . Years of education: Not on file  . Highest education level: Not on file  Occupational History  . Not on file  Tobacco Use  . Smoking status: Never Smoker  . Smokeless tobacco: Never Used  Vaping Use  . Vaping Use: Never used   Substance and Sexual Activity  . Alcohol use: Never  . Drug use: Never  . Sexual activity: Yes    Birth control/protection: None  Other Topics Concern  . Not on file  Social History Narrative  . Not on file   Social Determinants of Health   Financial Resource Strain: Not on file  Food Insecurity: Not on file  Transportation Needs: Not on file  Physical Activity: Not on file  Stress: Not on file  Social Connections: Not on file    Family History  Problem Relation Age of Onset  . Cancer Paternal Grandfather   . Hypertension Maternal Grandmother   . Heart disease Maternal Grandfather   . Hypertension Father   . Hypertension Mother   . Heart disease Mother     Medications:       Current Outpatient Medications:  .  Doxylamine-Pyridoxine (DICLEGIS) 10-10 MG TBEC, 2 tabs q hs, if sx persist add 1 tab q am on day 3, if sx persist add 1 tab q afternoon on day 4 (Patient not taking: Reported on  06/22/2020), Disp: 100 tablet, Rfl: 6 .  omeprazole (PRILOSEC) 20 MG capsule, Take 1 capsule (20 mg total) by mouth daily. 1 tablet a day, Disp: 30 capsule, Rfl: 6 .  ondansetron (ZOFRAN ODT) 8 MG disintegrating tablet, Take 1 tablet (8 mg total) by mouth every 8 (eight) hours as needed for nausea or vomiting., Disp: 20 tablet, Rfl: 6 .  ondansetron (ZOFRAN) 4 MG tablet, Take 4 mg by mouth every 8 (eight) hours as needed for nausea or vomiting., Disp: , Rfl:   Objective Blood pressure 98/63, pulse 80, height 5\' 7"  (1.702 m), weight 136 lb (61.7 kg), last menstrual period 05/06/2020, not currently breastfeeding.  POC sonogram  IUP, +fetal cardiac activity, 160s, with subchorionic hemorrhage present CRL noted to be consistent with GA by LMP     Pertinent ROS No burning with urination, frequency or urgency + nausea, vomiting or no diarrhea Nor fever chills or other constitutional symptoms   Labs or studies Sonogram      Impression Diagnoses this Encounter::   ICD-10-CM   1.  Subchorionic hemorrhage of placenta in first trimester, single or unspecified fetus  O41.8X10 [id number] OB Transvaginal   O46.8X1    repeat sonogram in 12 days([place] 2 weeks on 07/05/20)  2. Nausea and vomiting in pregnancy  O21.9    also with GERD: omeprazole + zofran ODT    Established relevant diagnosis(es):   Plan/Recommendations: Meds ordered this encounter  Medications  . omeprazole (PRILOSEC) 20 MG capsule    Sig: Take 1 capsule (20 mg total) by mouth daily. 1 tablet a day    Dispense:  30 capsule    Refill:  6  . ondansetron (ZOFRAN ODT) 8 MG disintegrating tablet    Sig: Take 1 tablet (8 mg total) by mouth every 8 (eight) hours as needed for nausea or vomiting.    Dispense:  20 tablet    Refill:  6    Labs or Scans Ordered: Orders Placed This Encounter  Procedures  . 09/04/20 OB Transvaginal    Management:: Viable IUP with subchorionic hemorrhage as source of spotting/episodic bleeding  Repeat scan 2 weeks    Follow up Return in about 11 days (around 07/03/2020) for vaginal sonogram, NOB visit.      All questions were answered.

## 2020-06-22 NOTE — Telephone Encounter (Signed)
Please review recent labs & advise on scheduling needs

## 2020-06-22 NOTE — Telephone Encounter (Signed)
Add to my schedule ASAP this am and I will do a POC sonogram on her and we will go from there,  thanks

## 2020-07-03 ENCOUNTER — Encounter: Payer: Self-pay | Admitting: Obstetrics & Gynecology

## 2020-07-03 ENCOUNTER — Other Ambulatory Visit: Payer: Self-pay

## 2020-07-03 ENCOUNTER — Ambulatory Visit (INDEPENDENT_AMBULATORY_CARE_PROVIDER_SITE_OTHER): Payer: 59 | Admitting: Obstetrics & Gynecology

## 2020-07-03 VITALS — BP 113/72 | HR 89

## 2020-07-03 DIAGNOSIS — O418X10 Other specified disorders of amniotic fluid and membranes, first trimester, not applicable or unspecified: Secondary | ICD-10-CM | POA: Diagnosis not present

## 2020-07-03 DIAGNOSIS — O468X1 Other antepartum hemorrhage, first trimester: Secondary | ICD-10-CM

## 2020-07-03 DIAGNOSIS — O209 Hemorrhage in early pregnancy, unspecified: Secondary | ICD-10-CM

## 2020-07-03 NOTE — Progress Notes (Signed)
Follow up appointment for results  Chief Complaint  Patient presents with  . Follow-up    Blood pressure 113/72, pulse 89, last menstrual period 05/06/2020, not currently breastfeeding.  No bleeding past 2-3 days No cramping Still with severe N/V    CRL [redacted]w[redacted]d FHT 165 bpm Complete resolution of subchorionic hemorrhage  MEDS ordered this encounter: No orders of the defined types were placed in this encounter.   Orders for this encounter: No orders of the defined types were placed in this encounter.   Impression: 1. Subchorionic hemorrhage of placenta in first trimester, single or unspecified fetus, now resolved   2. Bleeding in early pregnancy    Plan: Begin routine OB care with NOB visit 4 weeks  Follow Up: Return in about 4 weeks (around 07/31/2020) for NOB visit.      All questions were answered.  Past Medical History:  Diagnosis Date  . Medical history non-contributory     Past Surgical History:  Procedure Laterality Date  . NO PAST SURGERIES      OB History    Gravida  4   Para  1   Term  1   Preterm      AB  1   Living  1     SAB  1   IAB      Ectopic      Multiple  0   Live Births  1           No Known Allergies  Social History   Socioeconomic History  . Marital status: Married    Spouse name: Thornell Mule  . Number of children: 1  . Years of education: Not on file  . Highest education level: Not on file  Occupational History  . Not on file  Tobacco Use  . Smoking status: Never Smoker  . Smokeless tobacco: Never Used  Vaping Use  . Vaping Use: Never used  Substance and Sexual Activity  . Alcohol use: Never  . Drug use: Never  . Sexual activity: Yes    Birth control/protection: None  Other Topics Concern  . Not on file  Social History Narrative  . Not on file   Social Determinants of Health   Financial Resource Strain: Not on file  Food Insecurity: Not on file  Transportation Needs: Not on file   Physical Activity: Not on file  Stress: Not on file  Social Connections: Not on file    Family History  Problem Relation Age of Onset  . Cancer Paternal Grandfather   . Hypertension Maternal Grandmother   . Heart disease Maternal Grandfather   . Hypertension Father   . Hypertension Mother   . Heart disease Mother

## 2020-07-24 ENCOUNTER — Other Ambulatory Visit: Payer: Self-pay | Admitting: Adult Health

## 2020-07-24 ENCOUNTER — Telehealth: Payer: Self-pay | Admitting: Women's Health

## 2020-07-24 NOTE — Telephone Encounter (Signed)
Spoke to pharmacy who stated medication was ready for pickup.  Informed patient.

## 2020-07-24 NOTE — Telephone Encounter (Signed)
Patient came into the office thinking today was her appointment, but I informed pt it is next week. She states that she has tried to get a refill of her Zofran through her pharmacy but they are telling her that she needs to contact us because it is not going through. Please send a refill of her Zofran to her pharmacy and please contact when sent.

## 2020-07-31 ENCOUNTER — Encounter: Payer: 59 | Admitting: Women's Health

## 2020-07-31 ENCOUNTER — Ambulatory Visit: Payer: 59 | Admitting: *Deleted

## 2020-07-31 ENCOUNTER — Encounter: Payer: Self-pay | Admitting: Women's Health

## 2020-07-31 DIAGNOSIS — Z3490 Encounter for supervision of normal pregnancy, unspecified, unspecified trimester: Secondary | ICD-10-CM | POA: Insufficient documentation

## 2020-08-07 ENCOUNTER — Telehealth: Payer: Self-pay

## 2020-08-07 NOTE — Telephone Encounter (Signed)
Patients husband called requesting to speak with you ph# [phone]

## 2020-08-07 NOTE — Telephone Encounter (Signed)
Husband states Alyssa Hodge is having wisdom tooth pain and is wanting to know what they need to do. Advised she could take Tylenol to help with the pain or warm compress but needs to see a dentist to have this looked at.  Dental release can be sent if needed.  Verbalized understanding and stated he would call.

## 2020-08-09 ENCOUNTER — Other Ambulatory Visit: Payer: Self-pay | Admitting: Advanced Practice Midwife

## 2020-08-09 ENCOUNTER — Encounter: Payer: Self-pay | Admitting: Advanced Practice Midwife

## 2020-08-09 ENCOUNTER — Ambulatory Visit: Payer: 59 | Admitting: *Deleted

## 2020-08-09 ENCOUNTER — Other Ambulatory Visit: Payer: Self-pay

## 2020-08-09 ENCOUNTER — Ambulatory Visit (INDEPENDENT_AMBULATORY_CARE_PROVIDER_SITE_OTHER): Payer: 59 | Admitting: Advanced Practice Midwife

## 2020-08-09 VITALS — BP 120/73 | HR 88 | Wt 137.0 lb

## 2020-08-09 DIAGNOSIS — Z3A13 13 weeks gestation of pregnancy: Secondary | ICD-10-CM

## 2020-08-09 DIAGNOSIS — Z124 Encounter for screening for malignant neoplasm of cervix: Secondary | ICD-10-CM

## 2020-08-09 DIAGNOSIS — Z113 Encounter for screening for infections with a predominantly sexual mode of transmission: Secondary | ICD-10-CM

## 2020-08-09 DIAGNOSIS — Z3480 Encounter for supervision of other normal pregnancy, unspecified trimester: Secondary | ICD-10-CM

## 2020-08-09 LAB — POCT URINALYSIS DIPSTICK OB
Blood, UA: NEGATIVE
Glucose, UA: NEGATIVE
Ketones, UA: NEGATIVE
Leukocytes, UA: NEGATIVE
Nitrite, UA: NEGATIVE

## 2020-08-09 MED ORDER — DOXYLAMINE-PYRIDOXINE 10-10 MG PO TBEC: DELAYED_RELEASE_TABLET | ORAL | 6 refills | Status: DC

## 2020-08-09 MED ORDER — ONDANSETRON HCL 4 MG PO TABS: 4.0000 mg | ORAL_TABLET | Freq: Three times a day (TID) | ORAL | 2 refills | Status: DC | PRN

## 2020-08-09 NOTE — Progress Notes (Signed)
INITIAL OBSTETRICAL VISIT Patient name: Alyssa Hodge MRN [id number]  Date of birth: 06-08-1980 Chief Complaint:   Initial Prenatal Visit (Tooth pain)  History of Present Illness:   Alyssa Hodge is a 40 y.o. G40P1011  female at [redacted]w[redacted]d by LMP c/w u/s at 8 weeks with an Estimated Date of Delivery: 02/10/21 being seen today for her initial obstetrical visit.   Her obstetrical history is significant for 10 week SAB.   Today she reports fatigue and nausea.  Depression screen [facility] 2/9 08/09/2020 06/15/2018 05/11/2018  Decreased Interest 0 1 0  Down, Depressed, Hopeless 0 0 0  PHQ - 2 Score 0 1 0  Altered sleeping 0 1 -  Tired, decreased energy 1 1 -  Change in appetite 1 3 -  Feeling bad or failure about yourself  0 1 -  Trouble concentrating 0 0 -  Moving slowly or fidgety/restless 0 0 -  Suicidal thoughts 0 0 -  PHQ-9 Score 2 7 -    Patient's last menstrual period was 05/06/2020 (exact date). Last pap 07/20/18. Results were: abnormal  LSIL/HPV. Declines pap today Review of Systems:   Pertinent items are noted in HPI Denies cramping/contractions, leakage of fluid, vaginal bleeding, abnormal vaginal discharge w/ itching/odor/irritation, headaches, visual changes, shortness of breath, chest pain, abdominal pain, severe nausea/vomiting, or problems with urination or bowel movements unless otherwise stated above.  Pertinent History Reviewed:  Reviewed past medical,surgical, social, obstetrical and family history.  Reviewed problem list, medications and allergies. OB History  Gravida Para Term Preterm AB Living  3 1 1   1 1   SAB IAB Ectopic Multiple Live Births  1     0 1    # Outcome Date GA Lbr Len/2nd Weight Sex Delivery Anes PTL Lv  3 Current           2 SAB 03/20/20 [redacted]w[redacted]d         1 Term 12/22/18 [redacted]w[redacted]d / 02:23 6 lb 8.1 oz (2.951 kg) M Vag-Spont EPI  LIV   Physical Assessment:   Vitals:   08/09/20 1334  BP: 120/73  Pulse: 88  Weight: 137 lb (62.1 kg)  Body mass index is 21.46  kg/m.       Physical Examination:  General appearance - well appearing, and in no distress  Mental status - alert, oriented to person, place, and time  Psych:  She has a normal mood and affect  Skin - warm and dry, normal color, no suspicious lesions noted  Chest - effort normal  Heart - normal rate and regular rhythm  Abdomen - soft, nontender  Extremities:  No swelling or varicosities noted  Bedside 10/09/20 reveals active fetus, FHR ~ 150   Results for orders placed or performed in visit on 08/09/20 (from the past 24 hour(s))  POC Urinalysis Dipstick OB   Collection Time: 08/09/20  2:02 PM  Result Value Ref Range   Color, UA     Clarity, UA     Glucose, UA Negative Negative   Bilirubin, UA     Ketones, UA neg    Spec Grav, UA     Blood, UA neg    pH, UA     POC,PROTEIN,UA Trace Negative, Trace, Small (1+), Moderate (2+), Large (3+), 4+   Urobilinogen, UA     Nitrite, UA neg    Leukocytes, UA Negative Negative   Appearance     Odor      Assessment & Plan:  1) Low-Risk Pregnancy G3P1011 at  [redacted]w[redacted]d with an Estimated Date of Delivery: 02/10/21   2) Initial OB visit  3) Abnormal pap, needs repeat, declines today  Meds: No orders of the defined types were placed in this encounter. Meds ordered this encounter  Medications   Doxylamine-Pyridoxine (DICLEGIS) 10-10 MG TBEC    Sig: Take 2 qhs; may also take one in am and one in afternoon prn nausea    Dispense:  120 tablet    Refill:  6    Order Specific Question:   Supervising Provider    Answer:   Duane Lope H [[id number]]   ondansetron (ZOFRAN) 4 MG tablet    Sig: Take 1 tablet (4 mg total) by mouth every 8 (eight) hours as needed for nausea or vomiting.    Dispense:  30 tablet    Refill:  2    Order Specific Question:   Supervising Provider    Answer:   Duane Lope H [[id number]]     Initial labs obtained Continue prenatal vitamins Reviewed n/v relief measures and warning s/s to report Reviewed recommended weight gain based  on pre-gravid BMI Encouraged well-balanced diet Genetic & carrier screening discussed: declines NT/IT and Horizon , requests Panorama and AFP Ultrasound discussed; fetal survey: requested CCNC completed> form faxed if has or is planning to apply for medicaid The nature of [facility] [employer] with multiple MDs and other Advanced Practice Providers was explained to patient; also emphasized that fellows, residents, and students are part of our team.given a  home bp cuff.. Check bp weekly, let us know if >140/90.        Scarlette Calico Cresenzo-Dishmon 2:05 PM

## 2020-08-09 NOTE — Patient Instructions (Signed)
Tylicia Munnerlyn, I greatly value your feedback.  If you receive a survey following your visit with [place] today, we appreciate you taking the time to fill it out.  Thanks, Cathie Beams, DNP, CNM  [facility] HAS MOVED!!! It is now [facility] at [facility] ([address] [place], [place] [address]) Entrance located off of [address] Free 24/7 valet parking   Nausea & Vomiting Have saltine crackers or pretzels by your bed and eat a few bites before you raise your head out of bed in the morning Eat small frequent meals throughout the day instead of large meals Drink plenty of fluids throughout the day to stay hydrated, just don't drink a lot of fluids with your meals.  This can make your stomach fill up faster making you feel sick Do not brush your teeth right after you eat Products with real ginger are good for nausea, like ginger ale and ginger hard candy Make sure it says made with real ginger! Sucking on sour candy like lemon heads is also good for nausea If your prenatal vitamins make you nauseated, take them at night so you will sleep through the nausea Sea Bands If you feel like you need medicine for the nausea & vomiting please let us know If you are unable to keep any fluids or food down please let us know   Constipation Drink plenty of fluid, preferably water, throughout the day Eat foods high in fiber such as fruits, vegetables, and grains Exercise, such as walking, is a good way to keep your bowels regular Drink warm fluids, especially warm prune juice, or decaf coffee Eat a 1/2 cup of real oatmeal (not instant), 1/2 cup applesauce, and 1/2-1 cup warm prune juice every day If needed, you may take Colace (docusate sodium) stool softener once or twice a day to help keep the stool soft.  If you still are having problems with constipation, you may take Miralax once daily as needed to help keep your bowels regular.   Home Blood Pressure Monitoring for  Patients   Your provider has recommended that you check your blood pressure (BP) at least once a week at home. If you do not have a blood pressure cuff at home, one will be provided for you. Contact your provider if you have not received your monitor within 1 week.   Helpful Tips for Accurate Home Blood Pressure Checks  Don't smoke, exercise, or drink caffeine 30 minutes before checking your BP Use the restroom before checking your BP (a full bladder can raise your pressure) Relax in a comfortable upright chair Feet on the ground Left arm resting comfortably on a flat surface at the level of your heart Legs uncrossed Back supported Sit quietly and don't talk Place the cuff on your bare arm Adjust snuggly, so that only two fingertips can fit between your skin and the top of the cuff Check 2 readings separated by at least one minute Keep a log of your BP readings For a visual, please reference this diagram: http://ccnc.care/bpdiagram  Provider Name: [facility]     Phone: [phone]  Zone 1: ALL CLEAR  Continue to monitor your symptoms:  BP reading is less than 140 (top number) or less than 90 (bottom number)  No right upper stomach pain No headaches or seeing spots No feeling nauseated or throwing up No swelling in face and hands  Zone 2: CAUTION Call your doctor's office for any of the following:  BP  reading is greater than 140 (top number) or greater than 90 (bottom number)  Stomach pain under your ribs in the middle or right side Headaches or seeing spots Feeling nauseated or throwing up Swelling in face and hands  Zone 3: EMERGENCY  Seek immediate medical care if you have any of the following:  BP reading is greater than160 (top number) or greater than 110 (bottom number) Severe headaches not improving with Tylenol Serious difficulty catching your breath Any worsening symptoms from Zone 2    First Trimester of Pregnancy The first trimester of pregnancy is from  week 1 until the end of week 12 (months 1 through 3). A week after a sperm fertilizes an egg, the egg will implant on the wall of the uterus. This embryo will begin to develop into a baby. Genes from you and your partner are forming the baby. The female genes determine whether the baby is a boy or a girl. At 6-8 weeks, the eyes and face are formed, and the heartbeat can be seen on ultrasound. At the end of 12 weeks, all the baby's organs are formed.  Now that you are pregnant, you will want to do everything you can to have a healthy baby. Two of the most important things are to get good prenatal care and to follow your health care provider's instructions. Prenatal care is all the medical care you receive before the baby's birth. This care will help prevent, find, and treat any problems during the pregnancy and childbirth. BODY CHANGES Your body goes through many changes during pregnancy. The changes vary from woman to woman.  You may gain or lose a couple of pounds at first. You may feel sick to your stomach (nauseous) and throw up (vomit). If the vomiting is uncontrollable, call your health care provider. You may tire easily. You may develop headaches that can be relieved by medicines approved by your health care provider. You may urinate more often. Painful urination may mean you have a bladder infection. You may develop heartburn as a result of your pregnancy. You may develop constipation because certain hormones are causing the muscles that push waste through your intestines to slow down. You may develop hemorrhoids or swollen, bulging veins (varicose veins). Your breasts may begin to grow larger and become tender. Your nipples may stick out more, and the tissue that surrounds them (areola) may become darker. Your gums may bleed and may be sensitive to brushing and flossing. Dark spots or blotches (chloasma, mask of pregnancy) may develop on your face. This will likely fade after the baby is  born. Your menstrual periods will stop. You may have a loss of appetite. You may develop cravings for certain kinds of food. You may have changes in your emotions from day to day, such as being excited to be pregnant or being concerned that something may go wrong with the pregnancy and baby. You may have more vivid and strange dreams. You may have changes in your hair. These can include thickening of your hair, rapid growth, and changes in texture. Some women also have hair loss during or after pregnancy, or hair that feels dry or thin. Your hair will most likely return to normal after your baby is born. WHAT TO EXPECT AT YOUR PRENATAL VISITS During a routine prenatal visit: You will be weighed to make sure you and the baby are growing normally. Your blood pressure will be taken. Your abdomen will be measured to track your baby's growth. The fetal heartbeat  will be listened to starting around week 10 or 12 of your pregnancy. Test results from any previous visits will be discussed. Your health care provider may ask you: How you are feeling. If you are feeling the baby move. If you have had any abnormal symptoms, such as leaking fluid, bleeding, severe headaches, or abdominal cramping. If you have any questions. Other tests that may be performed during your first trimester include: Blood tests to find your blood type and to check for the presence of any previous infections. They will also be used to check for low iron levels (anemia) and Rh antibodies. Later in the pregnancy, blood tests for diabetes will be done along with other tests if problems develop. Urine tests to check for infections, diabetes, or protein in the urine. An ultrasound to confirm the proper growth and development of the baby. An amniocentesis to check for possible genetic problems. Fetal screens for spina bifida and Down syndrome. You may need other tests to make sure you and the baby are doing well. HOME CARE  INSTRUCTIONS  Medicines Follow your health care provider's instructions regarding medicine use. Specific medicines may be either safe or unsafe to take during pregnancy. Take your prenatal vitamins as directed. If you develop constipation, try taking a stool softener if your health care provider approves. Diet Eat regular, well-balanced meals. Choose a variety of foods, such as meat or vegetable-based protein, fish, milk and low-fat dairy products, vegetables, fruits, and whole grain breads and cereals. Your health care provider will help you determine the amount of weight gain that is right for you. Avoid raw meat and uncooked cheese. These carry germs that can cause birth defects in the baby. Eating four or five small meals rather than three large meals a day may help relieve nausea and vomiting. If you start to feel nauseous, eating a few soda crackers can be helpful. Drinking liquids between meals instead of during meals also seems to help nausea and vomiting. If you develop constipation, eat more high-fiber foods, such as fresh vegetables or fruit and whole grains. Drink enough fluids to keep your urine clear or pale yellow. Activity and Exercise Exercise only as directed by your health care provider. Exercising will help you: Control your weight. Stay in shape. Be prepared for labor and delivery. Experiencing pain or cramping in the lower abdomen or low back is a good sign that you should stop exercising. Check with your health care provider before continuing normal exercises. Try to avoid standing for long periods of time. Move your legs often if you must stand in one place for a long time. Avoid heavy lifting. Wear low-heeled shoes, and practice good posture. You may continue to have sex unless your health care provider directs you otherwise. Relief of Pain or Discomfort Wear a good support bra for breast tenderness.   Take warm sitz baths to soothe any pain or discomfort caused by  hemorrhoids. Use hemorrhoid cream if your health care provider approves.   Rest with your legs elevated if you have leg cramps or low back pain. If you develop varicose veins in your legs, wear support hose. Elevate your feet for 15 minutes, 3-4 times a day. Limit salt in your diet. Prenatal Care Schedule your prenatal visits by the twelfth week of pregnancy. They are usually scheduled monthly at first, then more often in the last 2 months before delivery. Write down your questions. Take them to your prenatal visits. Keep all your prenatal visits as directed by  your health care provider. Safety Wear your seat belt at all times when driving. Make a list of emergency phone numbers, including numbers for family, friends, the hospital, and police and fire departments. General Tips Ask your health care provider for a referral to a local prenatal education class. Begin classes no later than at the beginning of month 6 of your pregnancy. Ask for help if you have counseling or nutritional needs during pregnancy. Your health care provider can offer advice or refer you to specialists for help with various needs. Do not use hot tubs, steam rooms, or saunas. Do not douche or use tampons or scented sanitary pads. Do not cross your legs for long periods of time. Avoid cat litter boxes and soil used by cats. These carry germs that can cause birth defects in the baby and possibly loss of the fetus by miscarriage or stillbirth. Avoid all smoking, herbs, alcohol, and medicines not prescribed by your health care provider. Chemicals in these affect the formation and growth of the baby. Schedule a dentist appointment. At home, brush your teeth with a soft toothbrush and be gentle when you floss. SEEK MEDICAL CARE IF:  You have dizziness. You have mild pelvic cramps, pelvic pressure, or nagging pain in the abdominal area. You have persistent nausea, vomiting, or diarrhea. You have a bad smelling vaginal  discharge. You have pain with urination. You notice increased swelling in your face, hands, legs, or ankles. SEEK IMMEDIATE MEDICAL CARE IF:  You have a fever. You are leaking fluid from your vagina. You have spotting or bleeding from your vagina. You have severe abdominal cramping or pain. You have rapid weight gain or loss. You vomit blood or material that looks like coffee grounds. You are exposed to [place] measles and have never had them. You are exposed to fifth disease or chickenpox. You develop a severe headache. You have shortness of breath. You have any kind of trauma, such as from a fall or a car accident. Document Released: 02/11/2001 Document Revised: 07/04/2013 Document Reviewed: 12/28/2012 [facility] Patient Information 2015 [place], [place]. This information is not intended to replace advice given to you by your health care provider. Make sure you discuss any questions you have with your health care provider.  Coronavirus (COVID-19) Are you at risk?  Are you at risk for the Coronavirus (COVID-19)?  To be considered HIGH RISK for Coronavirus (COVID-19), you have to meet the following criteria:  Traveled to [place], [place], [place], [place] or [place];  and have fever, cough, and shortness of breath within the last 2 weeks of travel OR Been in close contact with a person diagnosed with COVID-19 within the last 2 weeks and have fever, cough, and shortness of breath IF YOU DO NOT MEET THESE CRITERIA, YOU ARE CONSIDERED LOW RISK FOR COVID-19.  What to do if you are HIGH RISK for COVID-19?  If you are having a medical emergency, call 911. Seek medical care right away. Before you go to a doctor's office, urgent care or emergency department, call ahead and tell them about your recent travel, contact with someone diagnosed with COVID-19, and your symptoms. You should receive instructions from your physician's office regarding next steps of care.  When you arrive at healthcare provider,  tell the healthcare staff immediately you have returned from visiting [place], [place], [place], [place] or [place]; in the last two weeks or you have been in close contact with a person diagnosed with COVID-19 in the last 2 weeks.  Tell the health care staff about your symptoms: fever, cough and shortness of breath. After you have been seen by a medical provider, you will be either: Tested for (COVID-19) and discharged home on quarantine except to seek medical care if symptoms worsen, and asked to  Stay home and avoid contact with others until you get your results (4-5 days)  Avoid travel on public transportation if possible (such as bus, train, or airplane) or Sent to the Emergency Department by EMS for evaluation, COVID-19 testing, and possible admission depending on your condition and test results.  What to do if you are LOW RISK for COVID-19?  Reduce your risk of any infection by using the same precautions used for avoiding the common cold or flu:  Wash your hands often with soap and warm water for at least 20 seconds.  If soap and water are not readily available, use an alcohol-based hand sanitizer with at least 60% alcohol.  If coughing or sneezing, cover your mouth and nose by coughing or sneezing into the elbow areas of your shirt or coat, into a tissue or into your sleeve (not your hands). Avoid shaking hands with others and consider head nods or verbal greetings only. Avoid touching your eyes, nose, or mouth with unwashed hands.  Avoid close contact with people who are sick. Avoid places or events with large numbers of people in one location, like concerts or sporting events. Carefully consider travel plans you have or are making. If you are planning any travel outside or inside the [place], visit the CDC's Travelers' Health webpage for the latest health notices. If you have some symptoms but not all symptoms, continue to monitor at home and seek medical attention if your symptoms worsen. If  you are having a medical emergency, call 911.   ADDITIONAL HEALTHCARE OPTIONS FOR [place] / e-Visit: [email or web address]         [facility]: [facility]: [id number]                   [facility]: [phone]     Safe Medications in Pregnancy   Acne: Benzoyl Peroxide Salicylic Acid  Backache/Headache: Tylenol: 2 regular strength every 4 hours OR              2 Extra strength every 6 hours  Colds/Coughs/Allergies: Benadryl (alcohol free) 25 mg every 6 hours as needed Breath right strips Claritin Cepacol throat lozenges Chloraseptic throat spray Cold-Eeze- up to three times per day Cough drops, alcohol free Flonase (by prescription only) Guaifenesin Mucinex Robitussin DM (plain only, alcohol free) Saline nasal spray/drops Sudafed (pseudoephedrine) & Actifed ** use only after [redacted] weeks gestation and if you do not have high blood pressure Tylenol Vicks Vaporub Zinc lozenges Zyrtec   Constipation: Colace Ducolax suppositories Fleet enema Glycerin suppositories Metamucil Milk of magnesia Miralax Senokot Smooth move tea  Diarrhea: Kaopectate Imodium A-D  *NO pepto Bismol  Hemorrhoids: Anusol Anusol HC Preparation H Tucks  Indigestion: Tums Maalox Mylanta Zantac  Pepcid  Insomnia: Benadryl (alcohol free) 36m every 6 hours as needed Tylenol PM Unisom, no Gelcaps  Leg Cramps: Tums MagGel  Nausea/Vomiting:  Bonine Dramamine Emetrol Ginger extract Sea bands Meclizine  Nausea medication to take during pregnancy:  Unisom (doxylamine succinate 25 mg tablets) Take one tablet daily at bedtime. If symptoms are not adequately controlled, the dose can be increased to a maximum recommended dose of two tablets daily (1/2 tablet in  the morning, 1/2 tablet mid-afternoon and one at bedtime). Vitamin B6 160m tablets. Take one  tablet twice a day (up to 200 mg per day).  Skin Rashes: Aveeno products Benadryl cream or 250mevery 6 hours as needed Calamine Lotion 1% cortisone cream  Yeast infection: Gyne-lotrimin 7 Monistat 7   **If taking multiple medications, please check labels to avoid duplicating the same active ingredients **take medication as directed on the label ** Do not exceed 4000 mg of tylenol in 24 hours **Do not take medications that contain aspirin or ibuprofen

## 2020-08-10 LAB — CBC/D/PLT+RPR+RH+ABO+RUBIGG...
Antibody Screen: NEGATIVE
Basophils Absolute: 0 10*3/uL (ref 0.0–0.2)
Basos: 0 %
EOS (ABSOLUTE): 0 10*3/uL (ref 0.0–0.4)
Eos: 0 %
HCV Ab: <0.1 s/co ratio (ref 0.0–0.9)
HIV Screen 4th Generation wRfx: NONREACTIVE
Hematocrit: 36.8 % (ref 34.0–46.6)
Hemoglobin: 12.1 g/dL (ref 11.1–15.9)
Hepatitis B Surface Ag: NEGATIVE
Immature Grans (Abs): 0 10*3/uL (ref 0.0–0.1)
Immature Granulocytes: 0 %
Lymphocytes Absolute: 1.6 10*3/uL (ref 0.7–3.1)
Lymphs: 17 %
MCH: 26.2 pg — ABNORMAL LOW (ref 26.6–33.0)
MCHC: 32.9 g/dL (ref 31.5–35.7)
MCV: 80 fL (ref 79–97)
Monocytes Absolute: 0.7 10*3/uL (ref 0.1–0.9)
Monocytes: 8 %
Neutrophils Absolute: 6.9 10*3/uL (ref 1.4–7.0)
Neutrophils: 75 %
Platelets: 354 10*3/uL (ref 150–450)
RBC: 4.62 x10E6/uL (ref 3.77–5.28)
RDW: 15 % (ref 11.7–15.4)
RPR Ser Ql: NONREACTIVE
Rh Factor: POSITIVE
Rubella Antibodies, IGG: 19.8 index (ref >0.99)
WBC: 9.3 10*3/uL (ref 3.4–10.8)

## 2020-08-10 LAB — HCV INTERPRETATION

## 2020-08-11 LAB — GC/CHLAMYDIA PROBE AMP
Chlamydia trachomatis, NAA: NEGATIVE
Neisseria Gonorrhoeae by PCR: NEGATIVE

## 2020-08-14 ENCOUNTER — Other Ambulatory Visit: Payer: Self-pay | Admitting: Women's Health

## 2020-08-14 DIAGNOSIS — R8271 Bacteriuria: Secondary | ICD-10-CM | POA: Insufficient documentation

## 2020-08-14 LAB — URINE CULTURE

## 2020-08-14 MED ORDER — NITROFURANTOIN MONOHYD MACRO 100 MG PO CAPS: 100.0000 mg | ORAL_CAPSULE | Freq: Two times a day (BID) | ORAL | 0 refills | Status: DC

## 2020-08-15 ENCOUNTER — Ambulatory Visit: Payer: 59 | Admitting: *Deleted

## 2020-08-15 ENCOUNTER — Encounter: Payer: 59 | Admitting: Advanced Practice Midwife

## 2020-08-21 ENCOUNTER — Encounter: Payer: Self-pay | Admitting: Advanced Practice Midwife

## 2020-09-17 ENCOUNTER — Other Ambulatory Visit: Payer: Self-pay | Admitting: Advanced Practice Midwife

## 2020-09-17 DIAGNOSIS — Z363 Encounter for antenatal screening for malformations: Secondary | ICD-10-CM

## 2020-09-18 ENCOUNTER — Ambulatory Visit (INDEPENDENT_AMBULATORY_CARE_PROVIDER_SITE_OTHER): Payer: 59 | Admitting: Women's Health

## 2020-09-18 ENCOUNTER — Encounter: Payer: Self-pay | Admitting: Women's Health

## 2020-09-18 ENCOUNTER — Ambulatory Visit (INDEPENDENT_AMBULATORY_CARE_PROVIDER_SITE_OTHER): Payer: 59

## 2020-09-18 ENCOUNTER — Other Ambulatory Visit (HOSPITAL_COMMUNITY)
Admission: RE | Admit: 2020-09-18 | Discharge: 2020-09-18 | Disposition: A | Discharge status: Home / Self Care | Payer: 59 | Source: Ambulatory Visit | Attending: Obstetrics & Gynecology | Admitting: Obstetrics & Gynecology

## 2020-09-18 ENCOUNTER — Other Ambulatory Visit: Payer: Self-pay

## 2020-09-18 VITALS — BP 101/68 | HR 87 | Wt 141.0 lb

## 2020-09-18 DIAGNOSIS — Z3A19 19 weeks gestation of pregnancy: Secondary | ICD-10-CM

## 2020-09-18 DIAGNOSIS — Z124 Encounter for screening for malignant neoplasm of cervix: Secondary | ICD-10-CM | POA: Diagnosis present

## 2020-09-18 DIAGNOSIS — Z363 Encounter for antenatal screening for malformations: Secondary | ICD-10-CM | POA: Diagnosis not present

## 2020-09-18 DIAGNOSIS — R8271 Bacteriuria: Secondary | ICD-10-CM

## 2020-09-18 DIAGNOSIS — Z1379 Encounter for other screening for genetic and chromosomal anomalies: Secondary | ICD-10-CM

## 2020-09-18 DIAGNOSIS — Z87440 Personal history of urinary (tract) infections: Secondary | ICD-10-CM

## 2020-09-18 DIAGNOSIS — Z3482 Encounter for supervision of other normal pregnancy, second trimester: Secondary | ICD-10-CM

## 2020-09-18 DIAGNOSIS — Z3480 Encounter for supervision of other normal pregnancy, unspecified trimester: Secondary | ICD-10-CM

## 2020-09-18 DIAGNOSIS — O99891 Other specified diseases and conditions complicating pregnancy: Secondary | ICD-10-CM

## 2020-09-18 NOTE — Progress Notes (Signed)
LOW-RISK PREGNANCY VISIT Patient name: Alyssa Hodge MRN [id number]  Date of birth: 1980/04/02 Chief Complaint:   Routine Prenatal Visit ([place] & pap today)  History of Present Illness:   Alyssa Hodge is a 40 y.o. G86P1011 female at [redacted]w[redacted]d with an Estimated Date of Delivery: 02/10/21 being seen today for ongoing management of a low-risk pregnancy.   Today she reports no complaints. Contractions: Irregular. Vag. Bleeding: None.  Movement: Present. denies leaking of fluid.  Depression screen [facility] 2/9 08/09/2020 06/15/2018 05/11/2018  Decreased Interest 0 1 0  Down, Depressed, Hopeless 0 0 0  PHQ - 2 Score 0 1 0  Altered sleeping 0 1 -  Tired, decreased energy 1 1 -  Change in appetite 1 3 -  Feeling bad or failure about yourself  0 1 -  Trouble concentrating 0 0 -  Moving slowly or fidgety/restless 0 0 -  Suicidal thoughts 0 0 -  PHQ-9 Score 2 7 -     GAD 7 : Generalized Anxiety Score 08/09/2020  Nervous, Anxious, on Edge 0  Control/stop worrying 0  Worry too much - different things 0  Trouble relaxing 0  Restless 0  Easily annoyed or irritable 0  Afraid - awful might happen 0  Total GAD 7 Score 0      Review of Systems:   Pertinent items are noted in HPI Denies abnormal vaginal discharge w/ itching/odor/irritation, headaches, visual changes, shortness of breath, chest pain, abdominal pain, severe nausea/vomiting, or problems with urination or bowel movements unless otherwise stated above. Pertinent History Reviewed:  Reviewed past medical,surgical, social, obstetrical and family history.  Reviewed problem list, medications and allergies. Physical Assessment:   Vitals:   09/18/20 1124  BP: 101/68  Pulse: 87  Weight: 141 lb (64 kg)  Body mass index is 22.08 kg/m.        Physical Examination:   General appearance: Well appearing, and in no distress  Mental status: Alert, oriented to person, place, and time  Skin: Warm & dry  Cardiovascular: Normal heart rate  noted  Respiratory: Normal respiratory effort, no distress  Abdomen: Soft, gravid, nontender  Pelvic: Cervical exam deferred         Extremities: Edema: None  Fetal Status: Fetal Heart Rate (bpm): 150 u/s   Movement: Present   [place] 19+2 wks,transverse head right,fundal placenta gr 0,normal ovaries,FHR 150 bpm,svp of fluid 6.8 cm,EFW 392 g 99.7%,anatomy complete,no obvious abnormalities  Chaperone: N/A   No results found for this or any previous visit (from the past 24 hour(s)).  Assessment & Plan:  1) Low-risk pregnancy G3P1011 at [redacted]w[redacted]d with an Estimated Date of Delivery: 02/10/21   2) Screening for cervical cancer, pap today   Meds: No orders of the defined types were placed in this encounter.  Labs/procedures today: pap, U/S, and AFP  Plan:  Continue routine obstetrical care  Next visit: prefers in person    Reviewed: Preterm labor symptoms and general obstetric precautions including but not limited to vaginal bleeding, contractions, leaking of fluid and fetal movement were reviewed in detail with the patient.  All questions were answered. Does have home bp cuff. Office bp cuff given: not applicable. Check bp weekly, let us know if consistently >140 and/or >90.  Follow-up: Return in about 4 weeks (around 10/16/2020) for LROB, CNM, in person.  Future Appointments  Date Time Provider Department Center  11/20/2020 11:30 AM Wilson Singer, MD [facility] None    Orders Placed This Encounter  Procedures  Urine Culture   AFP, Serum, Open Spina Bifida   Cheral Marker CNM, [facility] 09/18/2020 11:49 AM

## 2020-09-18 NOTE — Progress Notes (Signed)
[place] 19+2 wks,transverse head right,fundal placenta gr 0,normal ovaries,FHR 150 bpm,svp of fluid 6.8 cm,EFW 392 g 99.7%,anatomy complete,no obvious abnormalities

## 2020-09-18 NOTE — Patient Instructions (Signed)
Alyssa Hodge, thank you for choosing our office today! We appreciate the opportunity to meet your healthcare needs. You may receive a short survey by mail, e-mail, or through [employer]. If you are happy with your care we would appreciate if you could take just a few minutes to complete the survey questions. We read all of your comments and take your feedback very seriously. Thank you again for choosing our office.  Center for [employer] Team at [facility] at [facility] ([address] [place], [place] [address]) Entrance C, located off of [address] Free 24/7 valet parking  Go to [email or web address] to register for FREE online childbirth classes  Call the office [phone]) or go to [facility] if: You begin to severe cramping Your water breaks.  Sometimes it is a big gush of fluid, sometimes it is just a trickle that keeps getting your panties wet or running down your legs You have vaginal bleeding.  It is normal to have a small amount of spotting if your cervix was checked.   [facility] Pediatricians/Family Doctors [facility]): [address] [place], [phone]           [facility]: [address], [phone]                [facility] Medicine [facility]): [address], [phone] (call to ask if accepting patients) [facility]: [address], [place], [phone]    [facility] Pediatricians/Family Doctors [facility]): [phone] [address], [phone] [facility]: [address] [place], [phone] [facility]: [address], [phone]  [facility] Doctors  [facility]): [phone] [facility]: [address] [phone]   [facility] Doctors [facility]: [address], [phone]  [facility] Doctors  [facility]: [phone], [phone]  Home Blood Pressure Monitoring for Patients   Your provider has recommended that you check your blood pressure (BP) at least once a week at home. If you do not have a blood pressure cuff at home, one will be provided for you. Contact your provider if you have not received your monitor within 1 week.   Helpful Tips for Accurate Home Blood Pressure Checks  Don't smoke, exercise, or drink caffeine 30 minutes before checking your BP Use the restroom before checking your BP (a full bladder can raise your pressure) Relax in a comfortable upright chair Feet on the ground Left arm resting comfortably on a flat surface at the level of your heart Legs uncrossed Back supported Sit quietly and don't talk Place the cuff on your bare arm Adjust snuggly, so that only two fingertips can fit between your skin and the top of the cuff Check 2 readings separated by at least one minute Keep a log of your BP readings For a visual, please reference this diagram: http://ccnc.care/bpdiagram  Provider Name: [facility]     Phone: [phone]  Zone 1: ALL CLEAR  Continue to monitor your symptoms:  BP reading is less than 140 (top number) or less than 90 (bottom number)  No right upper stomach pain No headaches or seeing spots No feeling nauseated or throwing up No swelling in face and hands  Zone 2: CAUTION Call your doctor's office for any of the following:  BP reading is greater than 140 (top number) or greater than  90 (bottom number)  Stomach pain under your ribs in the middle or right side Headaches or seeing spots Feeling nauseated or throwing up Swelling in face and hands  Zone 3: EMERGENCY  Seek immediate medical care if you have any of the following:  BP reading is greater than160 (top number) or greater than 110 (bottom number) Severe headaches not improving with Tylenol Serious difficulty catching your breath Any worsening symptoms from  Zone 2     Second Trimester of Pregnancy The second trimester is from week 14 through week 27 (months 4 through 6). The second trimester is often a time when you feel your best. Your body has adjusted to being pregnant, and you begin to feel better physically. Usually, morning sickness has lessened or quit completely, you may have more energy, and you may have an increase in appetite. The second trimester is also a time when the fetus is growing rapidly. At the end of the sixth month, the fetus is about 9 inches long and weighs about 1 pounds. You will likely begin to feel the baby move (quickening) between 16 and 20 weeks of pregnancy. Body changes during your second trimester Your body continues to go through many changes during your second trimester. The changes vary from woman to woman. Your weight will continue to increase. You will notice your lower abdomen bulging out. You may begin to get stretch marks on your hips, abdomen, and breasts. You may develop headaches that can be relieved by medicines. The medicines should be approved by your health care provider. You may urinate more often because the fetus is pressing on your bladder. You may develop or continue to have heartburn as a result of your pregnancy. You may develop constipation because certain hormones are causing the muscles that push waste through your intestines to slow down. You may develop hemorrhoids or swollen, bulging veins (varicose veins). You may have back pain. This is caused by: Weight gain. Pregnancy hormones that are relaxing the joints in your pelvis. A shift in weight and the muscles that support your balance. Your breasts will continue to grow and they will continue to become tender. Your gums may bleed and may be sensitive to brushing and flossing. Dark spots or blotches (chloasma, mask of pregnancy) may develop on your face. This will likely fade after the baby is born. A dark line from your belly button to  the pubic area (linea nigra) may appear. This will likely fade after the baby is born. You may have changes in your hair. These can include thickening of your hair, rapid growth, and changes in texture. Some women also have hair loss during or after pregnancy, or hair that feels dry or thin. Your hair will most likely return to normal after your baby is born.  What to expect at prenatal visits During a routine prenatal visit: You will be weighed to make sure you and the fetus are growing normally. Your blood pressure will be taken. Your abdomen will be measured to track your baby's growth. The fetal heartbeat will be listened to. Any test results from the previous visit will be discussed.  Your health care provider may ask you: How you are feeling. If you are feeling the baby move. If you have had any abnormal symptoms, such as leaking fluid, bleeding, severe headaches, or abdominal cramping. If you are using any tobacco products, including cigarettes, chewing tobacco, and electronic cigarettes. If you have any questions.  Other tests that may be performed during   your second trimester include: Blood tests that check for: Low iron levels (anemia). High blood sugar that affects pregnant women (gestational diabetes) between 24 and 28 weeks. Rh antibodies. This is to check for a protein on red blood cells (Rh factor). Urine tests to check for infections, diabetes, or protein in the urine. An ultrasound to confirm the proper growth and development of the baby. An amniocentesis to check for possible genetic problems. Fetal screens for spina bifida and Down syndrome. HIV (human immunodeficiency virus) testing. Routine prenatal testing includes screening for HIV, unless you choose not to have this test.  Follow these instructions at home: Medicines Follow your health care provider's instructions regarding medicine use. Specific medicines may be either safe or unsafe to take during  pregnancy. Take a prenatal vitamin that contains at least 600 micrograms (mcg) of folic acid. If you develop constipation, try taking a stool softener if your health care provider approves. Eating and drinking Eat a balanced diet that includes fresh fruits and vegetables, whole grains, good sources of protein such as meat, eggs, or tofu, and low-fat dairy. Your health care provider will help you determine the amount of weight gain that is right for you. Avoid raw meat and uncooked cheese. These carry germs that can cause birth defects in the baby. If you have low calcium intake from food, talk to your health care provider about whether you should take a daily calcium supplement. Limit foods that are high in fat and processed sugars, such as fried and sweet foods. To prevent constipation: Drink enough fluid to keep your urine clear or pale yellow. Eat foods that are high in fiber, such as fresh fruits and vegetables, whole grains, and beans. Activity Exercise only as directed by your health care provider. Most women can continue their usual exercise routine during pregnancy. Try to exercise for 30 minutes at least 5 days a week. Stop exercising if you experience uterine contractions. Avoid heavy lifting, wear low heel shoes, and practice good posture. A sexual relationship may be continued unless your health care provider directs you otherwise. Relieving pain and discomfort Wear a good support bra to prevent discomfort from breast tenderness. Take warm sitz baths to soothe any pain or discomfort caused by hemorrhoids. Use hemorrhoid cream if your health care provider approves. Rest with your legs elevated if you have leg cramps or low back pain. If you develop varicose veins, wear support hose. Elevate your feet for 15 minutes, 3-4 times a day. Limit salt in your diet. Prenatal Care Write down your questions. Take them to your prenatal visits. Keep all your prenatal visits as told by your health  care provider. This is important. Safety Wear your seat belt at all times when driving. Make a list of emergency phone numbers, including numbers for family, friends, the hospital, and police and fire departments. General instructions Ask your health care provider for a referral to a local prenatal education class. Begin classes no later than the beginning of month 6 of your pregnancy. Ask for help if you have counseling or nutritional needs during pregnancy. Your health care provider can offer advice or refer you to specialists for help with various needs. Do not use hot tubs, steam rooms, or saunas. Do not douche or use tampons or scented sanitary pads. Do not cross your legs for long periods of time. Avoid cat litter boxes and soil used by cats. These carry germs that can cause birth defects in the baby and possibly loss of the   fetus by miscarriage or stillbirth. Avoid all smoking, herbs, alcohol, and unprescribed drugs. Chemicals in these products can affect the formation and growth of the baby. Do not use any products that contain nicotine or tobacco, such as cigarettes and e-cigarettes. If you need help quitting, ask your health care provider. Visit your dentist if you have not gone yet during your pregnancy. Use a soft toothbrush to brush your teeth and be gentle when you floss. Contact a health care provider if: You have dizziness. You have mild pelvic cramps, pelvic pressure, or nagging pain in the abdominal area. You have persistent nausea, vomiting, or diarrhea. You have a bad smelling vaginal discharge. You have pain when you urinate. Get help right away if: You have a fever. You are leaking fluid from your vagina. You have spotting or bleeding from your vagina. You have severe abdominal cramping or pain. You have rapid weight gain or weight loss. You have shortness of breath with chest pain. You notice sudden or extreme swelling of your face, hands, ankles, feet, or legs. You  have not felt your baby move in over an hour. You have severe headaches that do not go away when you take medicine. You have vision changes. Summary The second trimester is from week 14 through week 27 (months 4 through 6). It is also a time when the fetus is growing rapidly. Your body goes through many changes during pregnancy. The changes vary from woman to woman. Avoid all smoking, herbs, alcohol, and unprescribed drugs. These chemicals affect the formation and growth your baby. Do not use any tobacco products, such as cigarettes, chewing tobacco, and e-cigarettes. If you need help quitting, ask your health care provider. Contact your health care provider if you have any questions. Keep all prenatal visits as told by your health care provider. This is important. This information is not intended to replace advice given to you by your health care provider. Make sure you discuss any questions you have with your health care provider. Document Released: 02/11/2001 Document Revised: 07/26/2015 Document Reviewed: 04/20/2012 Elsevier Interactive Patient Education  2017 Elsevier Inc.  

## 2020-09-20 LAB — URINE CULTURE

## 2020-09-20 LAB — AFP, SERUM, OPEN SPINA BIFIDA
AFP MoM: 1.59
AFP Value: 88.8 ng/mL
Gest. Age on Collection Date: 19.2 weeks
Maternal Age At EDD: 38.5 yr
OSBR Risk 1 IN: 2155
Test Results:: NEGATIVE
Weight: 141 [lb_av]

## 2020-09-21 ENCOUNTER — Encounter: Payer: Self-pay | Admitting: Women's Health

## 2020-09-21 DIAGNOSIS — R87619 Unspecified abnormal cytological findings in specimens from cervix uteri: Secondary | ICD-10-CM | POA: Insufficient documentation

## 2020-09-21 LAB — CYTOLOGY - PAP
Comment: NEGATIVE
Comment: NEGATIVE
Diagnosis: UNDETERMINED — AB
HPV 16: NEGATIVE
HPV 18 / 45: NEGATIVE
High risk HPV: POSITIVE — AB

## 2020-10-16 ENCOUNTER — Other Ambulatory Visit: Payer: Self-pay

## 2020-10-16 ENCOUNTER — Ambulatory Visit (INDEPENDENT_AMBULATORY_CARE_PROVIDER_SITE_OTHER): Payer: 59 | Admitting: Obstetrics & Gynecology

## 2020-10-16 ENCOUNTER — Encounter: Payer: Self-pay | Admitting: Obstetrics & Gynecology

## 2020-10-16 VITALS — BP 115/74 | HR 96 | Wt 148.0 lb

## 2020-10-16 DIAGNOSIS — Z3482 Encounter for supervision of other normal pregnancy, second trimester: Secondary | ICD-10-CM

## 2020-10-16 DIAGNOSIS — R87610 Atypical squamous cells of undetermined significance on cytologic smear of cervix (ASC-US): Secondary | ICD-10-CM | POA: Diagnosis not present

## 2020-10-16 NOTE — Progress Notes (Signed)
LOW-RISK PREGNANCY VISIT Patient name: Alyssa Hodge MRN [id number]  Date of birth: 25-Aug-1980 Chief Complaint:   Routine Prenatal Visit (colpo)  History of Present Illness:   Alyssa Hodge is a 40 y.o. G19P1011 female at [redacted]w[redacted]d with an Estimated Date of Delivery: 02/10/21 being seen today for ongoing management of a low-risk pregnancy.   Preg c/b: 1) ASCUS, HPV+, []  plan for colposcopy today Denies vaginal bleeding  Depression screen [facility] 2/9 08/09/2020 06/15/2018 05/11/2018  Decreased Interest 0 1 0  Down, Depressed, Hopeless 0 0 0  PHQ - 2 Score 0 1 0  Altered sleeping 0 1 -  Tired, decreased energy 1 1 -  Change in appetite 1 3 -  Feeling bad or failure about yourself  0 1 -  Trouble concentrating 0 0 -  Moving slowly or fidgety/restless 0 0 -  Suicidal thoughts 0 0 -  PHQ-9 Score 2 7 -    Today she reports no complaints. Contractions: Not present. Vag. Bleeding: None.  Movement: Present. denies leaking of fluid. Review of Systems:   Pertinent items are noted in HPI Denies abnormal vaginal discharge w/ itching/odor/irritation, headaches, visual changes, shortness of breath, chest pain, abdominal pain, severe nausea/vomiting, or problems with urination or bowel movements unless otherwise stated above. Pertinent History Reviewed:  Reviewed past medical,surgical, social, obstetrical and family history.  Reviewed problem list, medications and allergies.  Physical Assessment:   Vitals:   10/16/20 1008  BP: 115/74  Pulse: 96  Weight: 148 lb (67.1 kg)  Body mass index is 23.18 kg/m.        Physical Examination:   General appearance: Well appearing, and in no distress  Mental status: Alert, oriented to person, place, and time  Skin: Warm & dry  Respiratory: Normal respiratory effort, no distress  Abdomen: Soft, gravid, nontender  Pelvic:  see colposcopy note          Extremities: Edema: None  Psych:  mood and affect appropriate  Fetal Status: Fetal Heart Rate (bpm): 145    Movement: Present     COLPOSCOPY PROCEDURE NOTE Patient name: Alyssa Hodge MRN [place]  Date of birth: 05-Feb-1981 Subjective Findings:   Alyssa Hodge is a 40 y.o. G36P1011  female at [redacted]w[redacted]d being seen today for a colposcopy. 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 Contraception: none. Menopausal: no. Hysterectomy: no.   Smoker: no.  Immunocompromised: no.  The risks and benefits were explained and informed consent was obtained, and written copy is in chart. Pertinent History Reviewed:   Reviewed past medical,surgical, social, obstetrical and family history.  Reviewed problem list, medications and allergies. Objective Findings & Procedure:   Vitals:   10/16/20 1008  BP: 115/74  Pulse: 96  Weight: 148 lb (67.1 kg)  Body mass index is 23.18 kg/m.  Time out was performed.  Speculum placed in the vagina, cervix fully visualized. SCJ: fully visualized. Cervix swabbed x 3 with acetic acid.  Acetowhitening present: Yes Cervix: no visible lesions. No biopsies taken.  Complications: none  Assessment & Plan:  1) Low-risk pregnancy G3P1011 at [redacted]w[redacted]d with an Estimated Date of Delivery: 02/10/21   2) ASCUS, HPV+, colposcopy completed, repeat pap in 17yr   Meds: No orders of  the defined types were placed in this encounter.  Labs/procedures today: colposcopy  Plan:  Continue routine obstetrical care  Next visit: prefers in person    Reviewed: Preterm labor symptoms and general obstetric precautions including but not limited to vaginal bleeding, contractions, leaking of fluid and fetal movement were reviewed in detail with the patient.  All questions were answered.    []  PN-2 next visit  Follow-up: Return in about 4 weeks (around 11/13/2020) for LROB  visit, PN-2.  No orders of the defined types were placed in this encounter.   11/15/2020, DO Attending Obstetrician & Gynecologist, [facility] for [facility]

## 2020-11-13 ENCOUNTER — Encounter: Payer: Self-pay | Admitting: Women's Health

## 2020-11-13 ENCOUNTER — Other Ambulatory Visit: Payer: 59

## 2020-11-13 ENCOUNTER — Other Ambulatory Visit: Payer: Self-pay

## 2020-11-13 ENCOUNTER — Ambulatory Visit (INDEPENDENT_AMBULATORY_CARE_PROVIDER_SITE_OTHER): Payer: 59 | Admitting: Women's Health

## 2020-11-13 VITALS — BP 109/71 | HR 92 | Wt 152.0 lb

## 2020-11-13 DIAGNOSIS — Z23 Encounter for immunization: Secondary | ICD-10-CM

## 2020-11-13 DIAGNOSIS — Z3480 Encounter for supervision of other normal pregnancy, unspecified trimester: Secondary | ICD-10-CM

## 2020-11-13 DIAGNOSIS — Z3A27 27 weeks gestation of pregnancy: Secondary | ICD-10-CM

## 2020-11-13 DIAGNOSIS — Z3482 Encounter for supervision of other normal pregnancy, second trimester: Secondary | ICD-10-CM

## 2020-11-13 NOTE — Progress Notes (Signed)
LOW-RISK PREGNANCY VISIT Patient name: Alyssa Hodge MRN [id number]  Date of birth: March 18, 1980 Chief Complaint:   Routine Prenatal Visit (PN2)  History of Present Illness:   Alyssa Hodge is a 40 y.o. G60P1011 female at [redacted]w[redacted]d with an Estimated Date of Delivery: 02/10/21 being seen today for ongoing management of a low-risk pregnancy.   Today she reports no complaints. Contractions: Not present. Vag. Bleeding: None.  Movement: Present. denies leaking of fluid.  Depression screen [facility] 2/9 08/09/2020 06/15/2018 05/11/2018  Decreased Interest 0 1 0  Down, Depressed, Hopeless 0 0 0  PHQ - 2 Score 0 1 0  Altered sleeping 0 1 -  Tired, decreased energy 1 1 -  Change in appetite 1 3 -  Feeling bad or failure about yourself  0 1 -  Trouble concentrating 0 0 -  Moving slowly or fidgety/restless 0 0 -  Suicidal thoughts 0 0 -  PHQ-9 Score 2 7 -     GAD 7 : Generalized Anxiety Score 08/09/2020  Nervous, Anxious, on Edge 0  Control/stop worrying 0  Worry too much - different things 0  Trouble relaxing 0  Restless 0  Easily annoyed or irritable 0  Afraid - awful might happen 0  Total GAD 7 Score 0      Review of Systems:   Pertinent items are noted in HPI Denies abnormal vaginal discharge w/ itching/odor/irritation, headaches, visual changes, shortness of breath, chest pain, abdominal pain, severe nausea/vomiting, or problems with urination or bowel movements unless otherwise stated above. Pertinent History Reviewed:  Reviewed past medical,surgical, social, obstetrical and family history.  Reviewed problem list, medications and allergies. Physical Assessment:   Vitals:   11/13/20 0947  BP: 109/71  Pulse: 92  Weight: 152 lb (68.9 kg)  Body mass index is 23.81 kg/m.        Physical Examination:   General appearance: Well appearing, and in no distress  Mental status: Alert, oriented to person, place, and time  Skin: Warm & dry  Cardiovascular: Normal heart rate noted  Respiratory:  Normal respiratory effort, no distress  Abdomen: Soft, gravid, nontender  Pelvic: Cervical exam deferred         Extremities: Edema: None  Fetal Status: Fetal Heart Rate (bpm): 142 Fundal Height: 26 cm Movement: Present    Chaperone: N/A   No results found for this or any previous visit (from the past 24 hour(s)).  Assessment & Plan:  1) Low-risk pregnancy G3P1011 at [redacted]w[redacted]d with an Estimated Date of Delivery: 02/10/21    Meds: No orders of the defined types were placed in this encounter.  Labs/procedures today: flu shot, tdap, and PN2  Plan:  Continue routine obstetrical care  Next visit: prefers in person    Reviewed: Preterm labor symptoms and general obstetric precautions including but not limited to vaginal bleeding, contractions, leaking of fluid and fetal movement were reviewed in detail with the patient.  All questions were answered. Does have home bp cuff. Office bp cuff given: not applicable. Check bp weekly, let us know if consistently >140 and/or >90.  Follow-up: Return in about 4 weeks (around 12/11/2020) for LROB, CNM, in person.  Future Appointments  Date Time Provider Department Center  12/11/2020 10:30 AM Cheral Marker, CNM [facility]    Orders Placed This Encounter  Procedures   Tdap vaccine greater than or equal to 7yo IM   Flu Vaccine QUAD 47mo+IM (Fluarix, Fluzone & Alfiuria Quad PF)   Cheral Marker CNM, [facility]  11/13/2020 10:07 AM

## 2020-11-13 NOTE — Patient Instructions (Signed)
Alyssa Hodge, thank you for choosing our office today! We appreciate the opportunity to meet your healthcare needs. You may receive a short survey by mail, e-mail, or through [employer]. If you are happy with your care we would appreciate if you could take just a few minutes to complete the survey questions. We read all of your comments and take your feedback very seriously. Thank you again for choosing our office.  Center for [employer] Team at [facility]  [facility] at [facility] ([address] [place], [place] [address]) Entrance C, located off of [address] Free 24/7 valet parking   CLASSES: Go to [email or web address] to register for classes (childbirth, breastfeeding, waterbirth, infant CPR, daddy bootcamp, etc.)  Call the office [phone]) or go to [facility] if: You begin to have strong, frequent contractions Your water breaks.  Sometimes it is a big gush of fluid, sometimes it is just a trickle that keeps getting your panties wet or running down your legs You have vaginal bleeding.  It is normal to have a small amount of spotting if your cervix was checked.  You don't feel your baby moving like normal.  If you don't, get you something to eat and drink and lay down and focus on feeling your baby move.   If your baby is still not moving like normal, you should call the office or go to [facility].  Call the office [phone]) or go to [facility] hospital for these signs of pre-eclampsia: Severe headache that does not go away with Tylenol Visual changes- seeing spots, double, blurred vision Pain under your right breast or upper abdomen that does not go away with Tums or heartburn medicine Nausea and/or vomiting Severe swelling in your hands, feet, and face   Tdap Vaccine It is recommended that you get the Tdap vaccine during the third trimester of EACH pregnancy to help protect your baby from getting pertussis (whooping cough) 27-36 weeks is the BEST time to do  this so that you can pass the protection on to your baby. During pregnancy is better than after pregnancy, but if you are unable to get it during pregnancy it will be offered at the hospital.  You can get this vaccine with [place], at the health department, your family doctor, or some local pharmacies Everyone who will be around your baby should also be up-to-date on their vaccines before the baby comes. Adults (who are not pregnant) only need 1 dose of Tdap during adulthood.   [facility] Pediatricians/Family Doctors  [facility]): [address] Colette Ribas, [phone]           [facility]: [address], [phone]                [facility] Medicine [facility]): [address], [phone] (call to ask if accepting patients) [facility] Department: [address], [place], [phone]    [facility] Pediatricians/Family Doctors [facility]): [phone] [address], [phone] [facility]: [address] [place], [phone] [facility]: [address], [phone]  [facility] Doctors  [facility]): [phone] [facility]: [address] [phone]   [facility] Doctors [facility]: [address] [phone]  [facility] Family Doctors  [facility]: [phone], [phone]  Home Blood Pressure Monitoring for Patients   Your provider has recommended that you check your  blood pressure (BP) at least once a week at home. If you do not have a blood pressure cuff at home, one will be provided for you. Contact your provider if you have not received your monitor within 1 week.   Helpful Tips for Accurate Home Blood Pressure Checks  Don't smoke, exercise, or drink caffeine 30 minutes before checking your BP Use the restroom before checking your BP (a full bladder can raise your  pressure) Relax in a comfortable upright chair Feet on the ground Left arm resting comfortably on a flat surface at the level of your heart Legs uncrossed Back supported Sit quietly and don't talk Place the cuff on your bare arm Adjust snuggly, so that only two fingertips can fit between your skin and the top of the cuff Check 2 readings separated by at least one minute Keep a log of your BP readings For a visual, please reference this diagram: http://ccnc.care/bpdiagram  Provider Name: [facility]     Phone: [phone]  Zone 1: ALL CLEAR  Continue to monitor your symptoms:  BP reading is less than 140 (top number) or less than 90 (bottom number)  No right upper stomach pain No headaches or seeing spots No feeling nauseated or throwing up No swelling in face and hands  Zone 2: CAUTION Call your doctor's office for any of the following:  BP reading is greater than 140 (top number) or greater than 90 (bottom number)  Stomach pain under your ribs in the middle or right side Headaches or seeing spots Feeling nauseated or throwing up Swelling in face and hands  Zone 3: EMERGENCY  Seek immediate medical care if you have any of the following:  BP reading is greater than160 (top number) or greater than 110 (bottom number) Severe headaches not improving with Tylenol Serious difficulty catching your breath Any worsening symptoms from Zone 2   Third Trimester of Pregnancy The third trimester is from week 29 through week 42, months 7 through 9. The third trimester is a time when the fetus is growing rapidly. At the end of the ninth month, the fetus is about 20 inches in length and weighs 6-10 pounds.  BODY CHANGES Your body goes through many changes during pregnancy. The changes vary from woman to woman.  Your weight will continue to increase. You can expect to gain 25-35 pounds (11-16 kg) by the end of the pregnancy. You may begin to get stretch marks on your hips, abdomen,  and breasts. You may urinate more often because the fetus is moving lower into your pelvis and pressing on your bladder. You may develop or continue to have heartburn as a result of your pregnancy. You may develop constipation because certain hormones are causing the muscles that push waste through your intestines to slow down. You may develop hemorrhoids or swollen, bulging veins (varicose veins). You may have pelvic pain because of the weight gain and pregnancy hormones relaxing your joints between the bones in your pelvis. Backaches may result from overexertion of the muscles supporting your posture. You may have changes in your hair. These can include thickening of your hair, rapid growth, and changes in texture. Some women also have hair loss during or after pregnancy, or hair that feels dry or thin. Your hair will most likely return to normal after your baby is born. Your breasts will continue to grow and be tender. A yellow discharge may leak from your breasts called colostrum. Your belly button may stick out. You may   feel short of breath because of your expanding uterus. You may notice the fetus "dropping," or moving lower in your abdomen. You may have a bloody mucus discharge. This usually occurs a few days to a week before labor begins. Your cervix becomes thin and soft (effaced) near your due date. WHAT TO EXPECT AT YOUR PRENATAL EXAMS  You will have prenatal exams every 2 weeks until week 36. Then, you will have weekly prenatal exams. During a routine prenatal visit: You will be weighed to make sure you and the fetus are growing normally. Your blood pressure is taken. Your abdomen will be measured to track your baby's growth. The fetal heartbeat will be listened to. Any test results from the previous visit will be discussed. You may have a cervical check near your due date to see if you have effaced. At around 36 weeks, your caregiver will check your cervix. At the same time, your  caregiver will also perform a test on the secretions of the vaginal tissue. This test is to determine if a type of bacteria, Group B streptococcus, is present. Your caregiver will explain this further. Your caregiver may ask you: What your birth plan is. How you are feeling. If you are feeling the baby move. If you have had any abnormal symptoms, such as leaking fluid, bleeding, severe headaches, or abdominal cramping. If you have any questions. Other tests or screenings that may be performed during your third trimester include: Blood tests that check for low iron levels (anemia). Fetal testing to check the health, activity level, and growth of the fetus. Testing is done if you have certain medical conditions or if there are problems during the pregnancy. FALSE LABOR You may feel small, irregular contractions that eventually go away. These are called Braxton Hicks contractions, or false labor. Contractions may last for hours, days, or even weeks before true labor sets in. If contractions come at regular intervals, intensify, or become painful, it is best to be seen by your caregiver.  SIGNS OF LABOR  Menstrual-like cramps. Contractions that are 5 minutes apart or less. Contractions that start on the top of the uterus and spread down to the lower abdomen and back. A sense of increased pelvic pressure or back pain. A watery or bloody mucus discharge that comes from the vagina. If you have any of these signs before the 37th week of pregnancy, call your caregiver right away. You need to go to the hospital to get checked immediately. HOME CARE INSTRUCTIONS  Avoid all smoking, herbs, alcohol, and unprescribed drugs. These chemicals affect the formation and growth of the baby. Follow your caregiver's instructions regarding medicine use. There are medicines that are either safe or unsafe to take during pregnancy. Exercise only as directed by your caregiver. Experiencing uterine cramps is a good sign to  stop exercising. Continue to eat regular, healthy meals. Wear a good support bra for breast tenderness. Do not use hot tubs, steam rooms, or saunas. Wear your seat belt at all times when driving. Avoid raw meat, uncooked cheese, cat litter boxes, and soil used by cats. These carry germs that can cause birth defects in the baby. Take your prenatal vitamins. Try taking a stool softener (if your caregiver approves) if you develop constipation. Eat more high-fiber foods, such as fresh vegetables or fruit and whole grains. Drink plenty of fluids to keep your urine clear or pale yellow. Take warm sitz baths to soothe any pain or discomfort caused by hemorrhoids. Use hemorrhoid cream if   your caregiver approves. If you develop varicose veins, wear support hose. Elevate your feet for 15 minutes, 3-4 times a day. Limit salt in your diet. Avoid heavy lifting, wear low heal shoes, and practice good posture. Rest a lot with your legs elevated if you have leg cramps or low back pain. Visit your dentist if you have not gone during your pregnancy. Use a soft toothbrush to brush your teeth and be gentle when you floss. A sexual relationship may be continued unless your caregiver directs you otherwise. Do not travel far distances unless it is absolutely necessary and only with the approval of your caregiver. Take prenatal classes to understand, practice, and ask questions about the labor and delivery. Make a trial run to the hospital. Pack your hospital bag. Prepare the baby's nursery. Continue to go to all your prenatal visits as directed by your caregiver. SEEK MEDICAL CARE IF: You are unsure if you are in labor or if your water has broken. You have dizziness. You have mild pelvic cramps, pelvic pressure, or nagging pain in your abdominal area. You have persistent nausea, vomiting, or diarrhea. You have a bad smelling vaginal discharge. You have pain with urination. SEEK IMMEDIATE MEDICAL CARE IF:  You  have a fever. You are leaking fluid from your vagina. You have spotting or bleeding from your vagina. You have severe abdominal cramping or pain. You have rapid weight loss or gain. You have shortness of breath with chest pain. You notice sudden or extreme swelling of your face, hands, ankles, feet, or legs. You have not felt your baby move in over an hour. You have severe headaches that do not go away with medicine. You have vision changes. Document Released: 02/11/2001 Document Revised: 02/22/2013 Document Reviewed: 04/20/2012 ExitCare Patient Information 2015 [employer]. This information is not intended to replace advice given to you by your health care provider. Make sure you discuss any questions you have with your health care provider.       

## 2020-11-14 LAB — CBC
Hematocrit: 34.4 % (ref 34.0–46.6)
Hemoglobin: 11.6 g/dL (ref 11.1–15.9)
MCH: 26.9 pg (ref 26.6–33.0)
MCHC: 33.7 g/dL (ref 31.5–35.7)
MCV: 80 fL (ref 79–97)
Platelets: 289 10*3/uL (ref 150–450)
RBC: 4.32 x10E6/uL (ref 3.77–5.28)
RDW: 14.7 % (ref 11.7–15.4)
WBC: 12.3 10*3/uL — ABNORMAL HIGH (ref 3.4–10.8)

## 2020-11-14 LAB — HIV ANTIBODY (ROUTINE TESTING W REFLEX): HIV Screen 4th Generation wRfx: NONREACTIVE

## 2020-11-14 LAB — GLUCOSE TOLERANCE, 2 HOURS W/ 1HR
Glucose, 1 hour: 111 mg/dL (ref 65–179)
Glucose, 2 hour: 90 mg/dL (ref 65–152)
Glucose, Fasting: 77 mg/dL (ref 65–91)

## 2020-11-14 LAB — ANTIBODY SCREEN: Antibody Screen: NEGATIVE

## 2020-11-14 LAB — RPR: RPR Ser Ql: NONREACTIVE

## 2020-11-20 ENCOUNTER — Encounter (INDEPENDENT_AMBULATORY_CARE_PROVIDER_SITE_OTHER): Payer: 59 | Admitting: Internal Medicine

## 2020-12-01 ENCOUNTER — Other Ambulatory Visit: Payer: Self-pay | Admitting: Obstetrics & Gynecology

## 2020-12-11 ENCOUNTER — Other Ambulatory Visit: Payer: Self-pay

## 2020-12-11 ENCOUNTER — Ambulatory Visit (INDEPENDENT_AMBULATORY_CARE_PROVIDER_SITE_OTHER): Payer: 59 | Admitting: Women's Health

## 2020-12-11 ENCOUNTER — Encounter: Payer: Self-pay | Admitting: Women's Health

## 2020-12-11 VITALS — BP 125/75 | HR 90 | Wt 156.0 lb

## 2020-12-11 DIAGNOSIS — Z3483 Encounter for supervision of other normal pregnancy, third trimester: Secondary | ICD-10-CM

## 2020-12-11 DIAGNOSIS — Z3A31 31 weeks gestation of pregnancy: Secondary | ICD-10-CM

## 2020-12-11 NOTE — Progress Notes (Signed)
    LOW-RISK PREGNANCY VISIT Patient name: Alyssa Hodge MRN [id number]  Date of birth: Jan 02, 1981 Chief Complaint:   Routine Prenatal Visit  History of Present Illness:   Alyssa Hodge is a 40 y.o. G58P1011 female at [redacted]w[redacted]d with an Estimated Date of Delivery: 02/10/21 being seen today for ongoing management of a low-risk pregnancy.   Today she reports no complaints. Contractions: Not present. Vag. Bleeding: None.  Movement: Present. denies leaking of fluid.  Depression screen [facility] 2/9 08/09/2020 06/15/2018 05/11/2018  Decreased Interest 0 1 0  Down, Depressed, Hopeless 0 0 0  PHQ - 2 Score 0 1 0  Altered sleeping 0 1 -  Tired, decreased energy 1 1 -  Change in appetite 1 3 -  Feeling bad or failure about yourself  0 1 -  Trouble concentrating 0 0 -  Moving slowly or fidgety/restless 0 0 -  Suicidal thoughts 0 0 -  PHQ-9 Score 2 7 -     GAD 7 : Generalized Anxiety Score 08/09/2020  Nervous, Anxious, on Edge 0  Control/stop worrying 0  Worry too much - different things 0  Trouble relaxing 0  Restless 0  Easily annoyed or irritable 0  Afraid - awful might happen 0  Total GAD 7 Score 0      Review of Systems:   Pertinent items are noted in HPI Denies abnormal vaginal discharge w/ itching/odor/irritation, headaches, visual changes, shortness of breath, chest pain, abdominal pain, severe nausea/vomiting, or problems with urination or bowel movements unless otherwise stated above. Pertinent History Reviewed:  Reviewed past medical,surgical, social, obstetrical and family history.  Reviewed problem list, medications and allergies. Physical Assessment:   Vitals:   12/11/20 1036  BP: 125/75  Pulse: 90  Weight: 156 lb (70.8 kg)  Body mass index is 24.43 kg/m.        Physical Examination:   General appearance: Well appearing, and in no distress  Mental status: Alert, oriented to person, place, and time  Skin: Warm & dry  Cardiovascular: Normal heart rate noted  Respiratory: Normal  respiratory effort, no distress  Abdomen: Soft, gravid, nontender  Pelvic: Cervical exam deferred         Extremities: Edema: None  Fetal Status: Fetal Heart Rate (bpm): 145 Fundal Height: 31 cm Movement: Present    Chaperone: N/A   No results found for this or any previous visit (from the past 24 hour(s)).  Assessment & Plan:  1) Low-risk pregnancy G3P1011 at [redacted]w[redacted]d with an Estimated Date of Delivery: 02/10/21    Meds: No orders of the defined types were placed in this encounter.  Labs/procedures today: none  Plan:  Continue routine obstetrical care  Next visit: prefers in person    Reviewed: Preterm labor symptoms and general obstetric precautions including but not limited to vaginal bleeding, contractions, leaking of fluid and fetal movement were reviewed in detail with the patient.  All questions were answered. Does have home bp cuff. Office bp cuff given: not applicable. Check bp weekly, let us know if consistently >140 and/or >90.  Follow-up: Return in about 2 weeks (around 12/25/2020) for LROB, CNM, in person.  Future Appointments  Date Time Provider Department Center  12/25/2020  1:50 PM Myna Hidalgo, DO [facility]    No orders of the defined types were placed in this encounter.  Cheral Marker CNM, [facility] 12/11/2020 10:49 AM

## 2020-12-11 NOTE — Patient Instructions (Signed)
Alyssa Hodge, thank you for choosing our office today! We appreciate the opportunity to meet your healthcare needs. You may receive a short survey by mail, e-mail, or through [employer]. If you are happy with your care we would appreciate if you could take just a few minutes to complete the survey questions. We read all of your comments and take your feedback very seriously. Thank you again for choosing our office.  Center for [employer] Team at [facility]  [facility] at [facility] ([address] [place], [place] [address]) Entrance C, located off of [address] Free 24/7 valet parking   CLASSES: Go to [email or web address] to register for classes (childbirth, breastfeeding, waterbirth, infant CPR, daddy bootcamp, etc.)  Call the office [phone]) or go to [facility] if: You begin to have strong, frequent contractions Your water breaks.  Sometimes it is a big gush of fluid, sometimes it is just a trickle that keeps getting your panties wet or running down your legs You have vaginal bleeding.  It is normal to have a small amount of spotting if your cervix was checked.  You don't feel your baby moving like normal.  If you don't, get you something to eat and drink and lay down and focus on feeling your baby move.   If your baby is still not moving like normal, you should call the office or go to [facility].  Call the office [phone]) or go to [facility] hospital for these signs of pre-eclampsia: Severe headache that does not go away with Tylenol Visual changes- seeing spots, double, blurred vision Pain under your right breast or upper abdomen that does not go away with Tums or heartburn medicine Nausea and/or vomiting Severe swelling in your hands, feet, and face   Tdap Vaccine It is recommended that you get the Tdap vaccine during the third trimester of EACH pregnancy to help protect your baby from getting pertussis (whooping cough) 27-36 weeks is the BEST time to do  this so that you can pass the protection on to your baby. During pregnancy is better than after pregnancy, but if you are unable to get it during pregnancy it will be offered at the hospital.  You can get this vaccine with [place], at the health department, your family doctor, or some local pharmacies Everyone who will be around your baby should also be up-to-date on their vaccines before the baby comes. Adults (who are not pregnant) only need 1 dose of Tdap during adulthood.   [facility] Pediatricians/Family Doctors [facility]): [address] Colette Ribas, [phone]           [facility]: [address], [phone]                [facility] Medicine [facility]): [address], [phone] (call to ask if accepting patients) [facility]: [address], [place], [phone]    [facility] Pediatricians/Family Doctors [facility]): [phone] [address], [phone] [facility]: [address] [place], [phone] [facility]: [address], [phone]  [facility] Doctors  [facility]): [phone] [facility]: [address], [phone]   [facility] Doctors [facility]: [address] [phone]  [facility] Family Doctors  [facility]: [phone], [phone]  Home Blood Pressure Monitoring for Patients   Your provider has recommended that you check your  blood pressure (BP) at least once a week at home. If you do not have a blood pressure cuff at home, one will be provided for you. Contact your provider if you have not received your monitor within 1 week.   Helpful Tips for Accurate Home Blood Pressure Checks  Don't smoke, exercise, or drink caffeine 30 minutes before checking your BP Use the restroom before checking your BP (a full bladder can raise your  pressure) Relax in a comfortable upright chair Feet on the ground Left arm resting comfortably on a flat surface at the level of your heart Legs uncrossed Back supported Sit quietly and don't talk Place the cuff on your bare arm Adjust snuggly, so that only two fingertips can fit between your skin and the top of the cuff Check 2 readings separated by at least one minute Keep a log of your BP readings For a visual, please reference this diagram: http://ccnc.care/bpdiagram  Provider Name: [facility]     Phone: [phone]  Zone 1: ALL CLEAR  Continue to monitor your symptoms:  BP reading is less than 140 (top number) or less than 90 (bottom number)  No right upper stomach pain No headaches or seeing spots No feeling nauseated or throwing up No swelling in face and hands  Zone 2: CAUTION Call your doctor's office for any of the following:  BP reading is greater than 140 (top number) or greater than 90 (bottom number)  Stomach pain under your ribs in the middle or right side Headaches or seeing spots Feeling nauseated or throwing up Swelling in face and hands  Zone 3: EMERGENCY  Seek immediate medical care if you have any of the following:  BP reading is greater than160 (top number) or greater than 110 (bottom number) Severe headaches not improving with Tylenol Serious difficulty catching your breath Any worsening symptoms from Zone 2  Preterm Labor and Birth Information  The normal length of a pregnancy is 39-41 weeks. Preterm labor is when labor starts before 37 completed weeks of pregnancy. What are the risk factors for preterm labor? Preterm labor is more likely to occur in women who: Have certain infections during pregnancy such as a bladder infection, sexually transmitted infection, or infection inside the uterus (chorioamnionitis). Have a shorter-than-normal cervix. Have gone into preterm labor before. Have had surgery on their cervix. Are younger than age 17  or older than age 35. Are African American. Are pregnant with twins or multiple babies (multiple gestation). Take street drugs or smoke while pregnant. Do not gain enough weight while pregnant. Became pregnant shortly after having been pregnant. What are the symptoms of preterm labor? Symptoms of preterm labor include: Cramps similar to those that can happen during a menstrual period. The cramps may happen with diarrhea. Pain in the abdomen or lower back. Regular uterine contractions that may feel like tightening of the abdomen. A feeling of increased pressure in the pelvis. Increased watery or bloody mucus discharge from the vagina. Water breaking (ruptured amniotic sac). Why is it important to recognize signs of preterm labor? It is important to recognize signs of preterm labor because babies who are born prematurely may not be fully developed. This can put them at an increased risk for: Long-term (chronic) heart and lung problems. Difficulty immediately after birth with regulating body systems, including blood sugar, body temperature, heart rate, and breathing rate. Bleeding in the brain. Cerebral palsy. Learning difficulties. Death. These risks are highest for babies who are born before 34 weeks   of pregnancy. How is preterm labor treated? Treatment depends on the length of your pregnancy, your condition, and the health of your baby. It may involve: Having a stitch (suture) placed in your cervix to prevent your cervix from opening too early (cerclage). Taking or being given medicines, such as: Hormone medicines. These may be given early in pregnancy to help support the pregnancy. Medicine to stop contractions. Medicines to help mature the baby's lungs. These may be prescribed if the risk of delivery is high. Medicines to prevent your baby from developing cerebral palsy. If the labor happens before 34 weeks of pregnancy, you may need to stay in the hospital. What should I do if I  think I am in preterm labor? If you think that you are going into preterm labor, call your health care provider right away. How can I prevent preterm labor in future pregnancies? To increase your chance of having a full-term pregnancy: Do not use any tobacco products, such as cigarettes, chewing tobacco, and e-cigarettes. If you need help quitting, ask your health care provider. Do not use street drugs or medicines that have not been prescribed to you during your pregnancy. Talk with your health care provider before taking any herbal supplements, even if you have been taking them regularly. Make sure you gain a healthy amount of weight during your pregnancy. Watch for infection. If you think that you might have an infection, get it checked right away. Make sure to tell your health care provider if you have gone into preterm labor before. This information is not intended to replace advice given to you by your health care provider. Make sure you discuss any questions you have with your health care provider. Document Revised: 06/11/2018 Document Reviewed: 07/11/2015 Elsevier Patient Education  2020 Elsevier Inc.   

## 2020-12-25 ENCOUNTER — Encounter: Payer: 59 | Admitting: Obstetrics & Gynecology

## 2021-01-01 ENCOUNTER — Ambulatory Visit (INDEPENDENT_AMBULATORY_CARE_PROVIDER_SITE_OTHER): Payer: 59 | Admitting: Obstetrics & Gynecology

## 2021-01-01 ENCOUNTER — Other Ambulatory Visit: Payer: Self-pay

## 2021-01-01 ENCOUNTER — Encounter: Payer: Self-pay | Admitting: Obstetrics & Gynecology

## 2021-01-01 VITALS — BP 109/73 | HR 97 | Wt 159.0 lb

## 2021-01-01 DIAGNOSIS — Z3A34 34 weeks gestation of pregnancy: Secondary | ICD-10-CM

## 2021-01-01 DIAGNOSIS — Z3480 Encounter for supervision of other normal pregnancy, unspecified trimester: Secondary | ICD-10-CM

## 2021-01-01 NOTE — Progress Notes (Signed)
   LOW-RISK PREGNANCY VISIT Patient name: Alyssa Hodge MRN [id number]  Date of birth: January 22, 1981 Chief Complaint:   Routine Prenatal Visit  History of Present Illness:   Alyssa Hodge is a 40 y.o. G58P1011 female at [redacted]w[redacted]d with an Estimated Date of Delivery: 02/10/21 being seen today for ongoing management of a low-risk pregnancy.  Depression screen [facility] 2/9 08/09/2020 06/15/2018 05/11/2018  Decreased Interest 0 1 0  Down, Depressed, Hopeless 0 0 0  PHQ - 2 Score 0 1 0  Altered sleeping 0 1 -  Tired, decreased energy 1 1 -  Change in appetite 1 3 -  Feeling bad or failure about yourself  0 1 -  Trouble concentrating 0 0 -  Moving slowly or fidgety/restless 0 0 -  Suicidal thoughts 0 0 -  PHQ-9 Score 2 7 -    Today she reports no complaints. Contractions: Irregular.  .  Movement: Present. denies leaking of fluid. Review of Systems:   Pertinent items are noted in HPI Denies abnormal vaginal discharge w/ itching/odor/irritation, headaches, visual changes, shortness of breath, chest pain, abdominal pain, severe nausea/vomiting, or problems with urination or bowel movements unless otherwise stated above. Pertinent History Reviewed:  Reviewed past medical,surgical, social, obstetrical and family history.  Reviewed problem list, medications and allergies. Physical Assessment:   Vitals:   01/01/21 1154  BP: 109/73  Pulse: 97  Weight: 159 lb (72.1 kg)  Body mass index is 24.9 kg/m.        Physical Examination:   General appearance: Well appearing, and in no distress  Mental status: Alert, oriented to person, place, and time  Skin: Warm & dry  Cardiovascular: Normal heart rate noted  Respiratory: Normal respiratory effort, no distress  Abdomen: Soft, gravid, nontender  Pelvic: Cervical exam deferred         Extremities: Edema: None  Fetal Status: Fetal Heart Rate (bpm): 134 Fundal Height: 32 cm Movement: Present    Chaperone: n/a    No results found for this or any previous visit (from  the past 24 hour(s)).  Assessment & Plan:  1) Low-risk pregnancy G3P1011 at [redacted]w[redacted]d with an Estimated Date of Delivery: 02/10/21   2) watch FH progression,    Meds: No orders of the defined types were placed in this encounter.  Labs/procedures today:   Plan:  Continue routine obstetrical care  Next visit: prefers in person    Reviewed: Preterm labor symptoms and general obstetric precautions including but not limited to vaginal bleeding, contractions, leaking of fluid and fetal movement were reviewed in detail with the patient.  All questions were answered. Has home bp cuff. Rx faxed to  Check bp weekly, let us know if >140/90.   Follow-up: Return in about 2 weeks (around 01/15/2021) for LROB.  No orders of the defined types were placed in this encounter.   Lazaro Arms, MD 01/01/2021 12:13 PM

## 2021-01-15 ENCOUNTER — Encounter: Payer: 59 | Admitting: Women's Health

## 2021-01-22 ENCOUNTER — Other Ambulatory Visit (HOSPITAL_COMMUNITY)
Admission: RE | Admit: 2021-01-22 | Discharge: 2021-01-22 | Disposition: A | Discharge status: Home / Self Care | Payer: 59 | Source: Ambulatory Visit | Attending: Women's Health | Admitting: Women's Health

## 2021-01-22 ENCOUNTER — Other Ambulatory Visit: Payer: Self-pay

## 2021-01-22 ENCOUNTER — Encounter: Payer: Self-pay | Admitting: Women's Health

## 2021-01-22 ENCOUNTER — Ambulatory Visit (INDEPENDENT_AMBULATORY_CARE_PROVIDER_SITE_OTHER): Payer: 59 | Admitting: Women's Health

## 2021-01-22 VITALS — BP 116/74 | HR 92 | Wt 161.0 lb

## 2021-01-22 DIAGNOSIS — Z3A37 37 weeks gestation of pregnancy: Secondary | ICD-10-CM

## 2021-01-22 DIAGNOSIS — O368130 Decreased fetal movements, third trimester, not applicable or unspecified: Secondary | ICD-10-CM

## 2021-01-22 DIAGNOSIS — Z3483 Encounter for supervision of other normal pregnancy, third trimester: Secondary | ICD-10-CM | POA: Insufficient documentation

## 2021-01-22 NOTE — Patient Instructions (Signed)
[place], thank you for choosing our office today! We appreciate the opportunity to meet your healthcare needs. You may receive a short survey by mail, e-mail, or through [employer]. If you are happy with your care we would appreciate if you could take just a few minutes to complete the survey questions. We read all of your comments and take your feedback very seriously. Thank you again for choosing our office.  Center for [employer] Team at [facility]  [facility] & Children's Center at [facility] ([address] [place], [place] [address]) Entrance C, located off of [address] Free 24/7 valet parking   CLASSES: Go to [email or web address] to register for classes (childbirth, breastfeeding, waterbirth, infant CPR, daddy bootcamp, etc.)  Call the office [phone]) or go to [facility] if: You begin to have strong, frequent contractions Your water breaks.  Sometimes it is a big gush of fluid, sometimes it is just a trickle that keeps getting your panties wet or running down your legs You have vaginal bleeding.  It is normal to have a small amount of spotting if your cervix was checked.  You don't feel your baby moving like normal.  If you don't, get you something to eat and drink and lay down and focus on feeling your baby move.   If your baby is still not moving like normal, you should call the office or go to [facility].  Call the office [phone]) or go to [facility] hospital for these signs of pre-eclampsia: Severe headache that does not go away with Tylenol Visual changes- seeing spots, double, blurred vision Pain under your right breast or upper abdomen that does not go away with Tums or heartburn medicine Nausea and/or vomiting Severe swelling in your hands, feet, and face   [facility] Pediatricians/Family Doctors [facility]): [address] Colette Ribas, [phone]           [facility]: [address], [phone]                 [facility]): [address], [phone] (call to ask if accepting patients) [facility]: [address], [place], [phone]    [facility] Pediatricians/Family Doctors [facility]): [address], [phone] [facility]: [address] [place], [phone] [facility]: [address], [phone]  [facility] Doctors  [facility]): [phone] [facility]: [address], [phone]   [facility] Doctors [facility]: [address], [phone]  [facility] Doctors  [facility]: [phone], [phone]  Home Blood Pressure Monitoring for Patients   Your provider has recommended that you check your blood pressure (BP) at least once a week at home. If you do not have a blood pressure cuff at home, one will be provided for you. Contact your provider if you have not received your monitor within 1 week.   Helpful Tips for Accurate Home Blood Pressure Checks  Don't smoke, exercise, or drink caffeine 30 minutes before checking your BP Use the restroom before checking your BP (a full bladder can raise your pressure) Relax in a comfortable upright chair Feet on the ground Left arm resting comfortably on a flat surface at the level of your heart Legs uncrossed Back supported Sit quietly and don't talk Place the cuff on your bare arm Adjust snuggly, so that only two fingertips  can fit between your skin and the top of the cuff Check 2 readings separated by at least one minute Keep a log of your BP readings For a visual, please reference this diagram: http://ccnc.care/bpdiagram  Provider Name: [facility]     Phone: [phone]  Zone 1: ALL CLEAR  Continue to monitor your symptoms:  BP reading is less than 140 (top number) or less than 90 (bottom number)  No right  upper stomach pain No headaches or seeing spots No feeling nauseated or throwing up No swelling in face and hands  Zone 2: CAUTION Call your doctor's office for any of the following:  BP reading is greater than 140 (top number) or greater than 90 (bottom number)  Stomach pain under your ribs in the middle or right side Headaches or seeing spots Feeling nauseated or throwing up Swelling in face and hands  Zone 3: EMERGENCY  Seek immediate medical care if you have any of the following:  BP reading is greater than160 (top number) or greater than 110 (bottom number) Severe headaches not improving with Tylenol Serious difficulty catching your breath Any worsening symptoms from Zone 2   Braxton Hicks Contractions Contractions of the uterus can occur throughout pregnancy, but they are not always a sign that you are in labor. You may have practice contractions called Braxton Hicks contractions. These false labor contractions are sometimes confused with true labor. What are [name] contractions? Braxton Hicks contractions are tightening movements that occur in the muscles of the uterus before labor. Unlike true labor contractions, these contractions do not result in opening (dilation) and thinning of the cervix. Toward the end of pregnancy (32-34 weeks), Braxton Hicks contractions can happen more often and may become stronger. These contractions are sometimes difficult to tell apart from true labor because they can be very uncomfortable. You should not feel embarrassed if you go to the hospital with false labor. Sometimes, the only way to tell if you are in true labor is for your health care provider to look for changes in the cervix. The health care provider will do a physical exam and may monitor your contractions. If you are not in true labor, the exam should show that your cervix is not dilating and your water has not broken. If there are no other health problems associated with your  pregnancy, it is completely safe for you to be sent home with false labor. You may continue to have Braxton Hicks contractions until you go into true labor. How to tell the difference between true labor and false labor True labor Contractions last 30-70 seconds. Contractions become very regular. Discomfort is usually felt in the top of the uterus, and it spreads to the lower abdomen and low back. Contractions do not go away with walking. Contractions usually become more intense and increase in frequency. The cervix dilates and gets thinner. False labor Contractions are usually shorter and not as strong as true labor contractions. Contractions are usually irregular. Contractions are often felt in the front of the lower abdomen and in the groin. Contractions may go away when you walk around or change positions while lying down. Contractions get weaker and are shorter-lasting as time goes on. The cervix usually does not dilate or become thin. Follow these instructions at home:  Take over-the-counter and prescription medicines only as told by your health care provider. Keep up with your usual exercises and follow other instructions from your health care provider. Eat and drink lightly if you think  you are going into labor. If Braxton Hicks contractions are making you uncomfortable: Change your position from lying down or resting to walking, or change from walking to resting. Sit and rest in a tub of warm water. Drink enough fluid to keep your urine pale yellow. Dehydration may cause these contractions. Do slow and deep breathing several times an hour. Keep all follow-up prenatal visits as told by your health care provider. This is important. Contact a health care provider if: You have a fever. You have continuous pain in your abdomen. Get help right away if: Your contractions become stronger, more regular, and closer together. You have fluid leaking or gushing from your vagina. You pass  blood-tinged mucus (bloody show). You have bleeding from your vagina. You have low back pain that you never had before. You feel your baby's head pushing down and causing pelvic pressure. Your baby is not moving inside you as much as it used to. Summary Contractions that occur before labor are called Braxton Hicks contractions, false labor, or practice contractions. Braxton Hicks contractions are usually shorter, weaker, farther apart, and less regular than true labor contractions. True labor contractions usually become progressively stronger and regular, and they become more frequent. Manage discomfort from Braxton Hicks contractions by changing position, resting in a warm bath, drinking plenty of water, or practicing deep breathing. This information is not intended to replace advice given to you by your health care provider. Make sure you discuss any questions you have with your health care provider. Document Revised: 01/30/2017 Document Reviewed: 07/03/2016 Elsevier Patient Education  2020 Elsevier Inc.   

## 2021-01-22 NOTE — Progress Notes (Signed)
    LOW-RISK PREGNANCY VISIT Patient name: Alyssa Hodge MRN [id number]  Date of birth: 1980-09-24 Chief Complaint:   Routine Prenatal Visit  History of Present Illness:   Alyssa Hodge is a 40 y.o. G24P1011 female at [redacted]w[redacted]d with an Estimated Date of Delivery: 02/10/21 being seen today for ongoing management of a low-risk pregnancy.   Today she reports  decreased fm x 2d . Contractions: Irregular. Vag. Bleeding: None.  Movement: Present. denies leaking of fluid.  Depression screen [facility] 2/9 08/09/2020 06/15/2018 05/11/2018  Decreased Interest 0 1 0  Down, Depressed, Hopeless 0 0 0  PHQ - 2 Score 0 1 0  Altered sleeping 0 1 -  Tired, decreased energy 1 1 -  Change in appetite 1 3 -  Feeling bad or failure about yourself  0 1 -  Trouble concentrating 0 0 -  Moving slowly or fidgety/restless 0 0 -  Suicidal thoughts 0 0 -  PHQ-9 Score 2 7 -     GAD 7 : Generalized Anxiety Score 08/09/2020  Nervous, Anxious, on Edge 0  Control/stop worrying 0  Worry too much - different things 0  Trouble relaxing 0  Restless 0  Easily annoyed or irritable 0  Afraid - awful might happen 0  Total GAD 7 Score 0      Review of Systems:   Pertinent items are noted in HPI Denies abnormal vaginal discharge w/ itching/odor/irritation, headaches, visual changes, shortness of breath, chest pain, abdominal pain, severe nausea/vomiting, or problems with urination or bowel movements unless otherwise stated above. Pertinent History Reviewed:  Reviewed past medical,surgical, social, obstetrical and family history.  Reviewed problem list, medications and allergies. Physical Assessment:   Vitals:   01/22/21 1342  BP: 116/74  Pulse: 92  Weight: 161 lb (73 kg)  Body mass index is 25.22 kg/m.        Physical Examination:   General appearance: Well appearing, and in no distress  Mental status: Alert, oriented to person, place, and time  Skin: Warm & dry  Cardiovascular: Normal heart rate noted  Respiratory: Normal  respiratory effort, no distress  Abdomen: Soft, gravid, nontender  Pelvic: Cervical exam performed  Dilation: 1 Effacement (%): Thick Station: -2  Extremities: Edema: None  Fetal Status: Fetal Heart Rate (bpm): 156 Fundal Height: 35 cm Movement: Present Presentation: Vertex NST: FHR baseline 135 bpm, Variability: moderate, Accelerations:present, Decelerations:  Absent= Cat 1/reactive Toco: none   Chaperone: Faith Rogue   No results found for this or any previous visit (from the past 24 hour(s)).  Assessment & Plan:  1) Low-risk pregnancy G3P1011 at [redacted]w[redacted]d with an Estimated Date of Delivery: 02/10/21   2) Decreased fm, reactive NST, discussed FKC   Meds: No orders of the defined types were placed in this encounter.  Labs/procedures today: GBS, GC/CT, SVE, and NST  Plan:  Continue routine obstetrical care  Next visit: prefers in person    Reviewed: Preterm labor symptoms and general obstetric precautions including but not limited to vaginal bleeding, contractions, leaking of fluid and fetal movement were reviewed in detail with the patient.  All questions were answered. Does have home bp cuff. Office bp cuff given: not applicable. Check bp weekly, let us know if consistently >140 and/or >90.  Follow-up: Return for weekly, CNM, in person.  No future appointments.  Orders Placed This Encounter  Procedures   Culture, beta strep (group b only)   Cheral Marker CNM, [facility] 01/22/2021 2:52 PM

## 2021-01-25 LAB — CERVICOVAGINAL ANCILLARY ONLY
Chlamydia: NEGATIVE
Comment: NEGATIVE
Comment: NORMAL
Neisseria Gonorrhea: NEGATIVE

## 2021-01-26 LAB — CULTURE, BETA STREP (GROUP B ONLY): Strep Gp B Culture: NEGATIVE

## 2021-01-29 ENCOUNTER — Encounter: Payer: Self-pay | Admitting: Women's Health

## 2021-01-29 ENCOUNTER — Ambulatory Visit (INDEPENDENT_AMBULATORY_CARE_PROVIDER_SITE_OTHER): Payer: 59 | Admitting: Women's Health

## 2021-01-29 ENCOUNTER — Other Ambulatory Visit: Payer: Self-pay

## 2021-01-29 VITALS — BP 129/78 | HR 94 | Wt 162.0 lb

## 2021-01-29 DIAGNOSIS — Z3483 Encounter for supervision of other normal pregnancy, third trimester: Secondary | ICD-10-CM

## 2021-01-29 NOTE — Patient Instructions (Signed)
Qu[place], thank you for choosing our office today! We appreciate the opportunity to meet your healthcare needs. You may receive a short survey by mail, e-mail, or through [employer]. If you are happy with your care we would appreciate if you could take just a few minutes to complete the survey questions. We read all of your comments and take your feedback very seriously. Thank you again for choosing our office.  Center for [employer] Team at [facility]  [facility] at [facility] ([address] [place], [place] [address]) Entrance C, located off of [address] Free 24/7 valet parking   CLASSES: Go to [email or web address] to register for classes (childbirth, breastfeeding, waterbirth, infant CPR, daddy bootcamp, etc.)  Call the office [phone]) or go to [facility] if: You begin to have strong, frequent contractions Your water breaks.  Sometimes it is a big gush of fluid, sometimes it is just a trickle that keeps getting your panties wet or running down your legs You have vaginal bleeding.  It is normal to have a small amount of spotting if your cervix was checked.  You don't feel your baby moving like normal.  If you don't, get you something to eat and drink and lay down and focus on feeling your baby move.   If your baby is still not moving like normal, you should call the office or go to [facility].  Call the office [phone]) or go to [facility] hospital for these signs of pre-eclampsia: Severe headache that does not go away with Tylenol Visual changes- seeing spots, double, blurred vision Pain under your right breast or upper abdomen that does not go away with Tums or heartburn medicine Nausea and/or vomiting Severe swelling in your hands, feet, and face   [facility] Pediatricians/Family Doctors [facility]): [address] Colette Ribas, [phone]           [facility]: [address], [phone]                 [facility]): [address], [phone] (call to ask if accepting patients) [facility]: [address], [place], [phone]    [facility] Pediatricians/Family Doctors [facility]): [address], [phone] [facility]: [address] [place], [phone] [facility]: [address], [phone]  [facility] Doctors  [facility]): [phone] [facility]: [address], [phone]   [facility] Doctors [facility]: [address], [phone]  [facility] Doctors  [facility]: [phone], [phone]  Home Blood Pressure Monitoring for Patients   Your provider has recommended that you check your blood pressure (BP) at least once a week at home. If you do not have a blood pressure cuff at home, one will be provided for you. Contact your provider if you have not received your monitor within 1 week.   Helpful Tips for Accurate Home Blood Pressure Checks  Don't smoke, exercise, or drink caffeine 30 minutes before checking your BP Use the restroom before checking your BP (a full bladder can raise your pressure) Relax in a comfortable upright chair Feet on the ground Left arm resting comfortably on a flat surface at the level of your heart Legs uncrossed Back supported Sit quietly and don't talk Place the cuff on your bare arm Adjust snuggly, so that only two fingertips  can fit between your skin and the top of the cuff Check 2 readings separated by at least one minute Keep a log of your BP readings For a visual, please reference this diagram: http://ccnc.care/bpdiagram  Provider Name: [facility]     Phone: [phone]  Zone 1: ALL CLEAR  Continue to monitor your symptoms:  BP reading is less than 140 (top number) or less than 90 (bottom number)  No right  upper stomach pain No headaches or seeing spots No feeling nauseated or throwing up No swelling in face and hands  Zone 2: CAUTION Call your doctor's office for any of the following:  BP reading is greater than 140 (top number) or greater than 90 (bottom number)  Stomach pain under your ribs in the middle or right side Headaches or seeing spots Feeling nauseated or throwing up Swelling in face and hands  Zone 3: EMERGENCY  Seek immediate medical care if you have any of the following:  BP reading is greater than160 (top number) or greater than 110 (bottom number) Severe headaches not improving with Tylenol Serious difficulty catching your breath Any worsening symptoms from Zone 2   Braxton Hicks Contractions Contractions of the uterus can occur throughout pregnancy, but they are not always a sign that you are in labor. You may have practice contractions called Braxton Hicks contractions. These false labor contractions are sometimes confused with true labor. What are [name] contractions? Braxton Hicks contractions are tightening movements that occur in the muscles of the uterus before labor. Unlike true labor contractions, these contractions do not result in opening (dilation) and thinning of the cervix. Toward the end of pregnancy (32-34 weeks), Braxton Hicks contractions can happen more often and may become stronger. These contractions are sometimes difficult to tell apart from true labor because they can be very uncomfortable. You should not feel embarrassed if you go to the hospital with false labor. Sometimes, the only way to tell if you are in true labor is for your health care provider to look for changes in the cervix. The health care provider will do a physical exam and may monitor your contractions. If you are not in true labor, the exam should show that your cervix is not dilating and your water has not broken. If there are no other health problems associated with your  pregnancy, it is completely safe for you to be sent home with false labor. You may continue to have Braxton Hicks contractions until you go into true labor. How to tell the difference between true labor and false labor True labor Contractions last 30-70 seconds. Contractions become very regular. Discomfort is usually felt in the top of the uterus, and it spreads to the lower abdomen and low back. Contractions do not go away with walking. Contractions usually become more intense and increase in frequency. The cervix dilates and gets thinner. False labor Contractions are usually shorter and not as strong as true labor contractions. Contractions are usually irregular. Contractions are often felt in the front of the lower abdomen and in the groin. Contractions may go away when you walk around or change positions while lying down. Contractions get weaker and are shorter-lasting as time goes on. The cervix usually does not dilate or become thin. Follow these instructions at home:  Take over-the-counter and prescription medicines only as told by your health care provider. Keep up with your usual exercises and follow other instructions from your health care provider. Eat and drink lightly if you think  you are going into labor. If Braxton Hicks contractions are making you uncomfortable: Change your position from lying down or resting to walking, or change from walking to resting. Sit and rest in a tub of warm water. Drink enough fluid to keep your urine pale yellow. Dehydration may cause these contractions. Do slow and deep breathing several times an hour. Keep all follow-up prenatal visits as told by your health care provider. This is important. Contact a health care provider if: You have a fever. You have continuous pain in your abdomen. Get help right away if: Your contractions become stronger, more regular, and closer together. You have fluid leaking or gushing from your vagina. You pass  blood-tinged mucus (bloody show). You have bleeding from your vagina. You have low back pain that you never had before. You feel your baby's head pushing down and causing pelvic pressure. Your baby is not moving inside you as much as it used to. Summary Contractions that occur before labor are called Braxton Hicks contractions, false labor, or practice contractions. Braxton Hicks contractions are usually shorter, weaker, farther apart, and less regular than true labor contractions. True labor contractions usually become progressively stronger and regular, and they become more frequent. Manage discomfort from [facility] contractions by changing position, resting in a warm bath, drinking plenty of water, or practicing deep breathing. This information is not intended to replace advice given to you by your health care provider. Make sure you discuss any questions you have with your health care provider. Document Revised: 01/30/2017 Document Reviewed: 07/03/2016 Elsevier Patient Education  [place].

## 2021-01-29 NOTE — Progress Notes (Signed)
    LOW-RISK PREGNANCY VISIT Patient name: Alyssa Hodge MRN [id number]  Date of birth: 1980-03-23 Chief Complaint:   Routine Prenatal Visit  History of Present Illness:   Alyssa Hodge is a 40 y.o. G58P1011 female at [redacted]w[redacted]d with an Estimated Date of Delivery: 02/10/21 being seen today for ongoing management of a low-risk pregnancy.   Today she reports no complaints. Contractions: Irregular. Vag. Bleeding: None.  Movement: Present. denies leaking of fluid.  Depression screen [facility] 2/9 08/09/2020 06/15/2018 05/11/2018  Decreased Interest 0 1 0  Down, Depressed, Hopeless 0 0 0  PHQ - 2 Score 0 1 0  Altered sleeping 0 1 -  Tired, decreased energy 1 1 -  Change in appetite 1 3 -  Feeling bad or failure about yourself  0 1 -  Trouble concentrating 0 0 -  Moving slowly or fidgety/restless 0 0 -  Suicidal thoughts 0 0 -  PHQ-9 Score 2 7 -     GAD 7 : Generalized Anxiety Score 08/09/2020  Nervous, Anxious, on Edge 0  Control/stop worrying 0  Worry too much - different things 0  Trouble relaxing 0  Restless 0  Easily annoyed or irritable 0  Afraid - awful might happen 0  Total GAD 7 Score 0      Review of Systems:   Pertinent items are noted in HPI Denies abnormal vaginal discharge w/ itching/odor/irritation, headaches, visual changes, shortness of breath, chest pain, abdominal pain, severe nausea/vomiting, or problems with urination or bowel movements unless otherwise stated above. Pertinent History Reviewed:  Reviewed past medical,surgical, social, obstetrical and family history.  Reviewed problem list, medications and allergies. Physical Assessment:   Vitals:   01/29/21 1143  BP: 129/78  Pulse: 94  Weight: 162 lb (73.5 kg)  Body mass index is 25.37 kg/m.        Physical Examination:   General appearance: Well appearing, and in no distress  Mental status: Alert, oriented to person, place, and time  Skin: Warm & dry  Cardiovascular: Normal heart rate noted  Respiratory: Normal  respiratory effort, no distress  Abdomen: Soft, gravid, nontender  Pelvic: Cervical exam performed  Dilation: 1.5 Effacement (%): Thick Station: -2  Extremities: Edema: None  Fetal Status: Fetal Heart Rate (bpm): 134 Fundal Height: 36 cm Movement: Present Presentation: Vertex  Chaperone: Angel Neas   No results found for this or any previous visit (from the past 24 hour(s)).  Assessment & Plan:  1) Low-risk pregnancy G3P1011 at [redacted]w[redacted]d with an Estimated Date of Delivery: 02/10/21    Meds: No orders of the defined types were placed in this encounter.  Labs/procedures today: SVE  Plan:  Continue routine obstetrical care  Next visit: prefers in person    Reviewed: Term labor symptoms and general obstetric precautions including but not limited to vaginal bleeding, contractions, leaking of fluid and fetal movement were reviewed in detail with the patient.  All questions were answered. Does have home bp cuff. Office bp cuff given: not applicable. Check bp weekly, let us know if consistently >140 and/or >90.  Follow-up: Return for As scheduled.  Future Appointments  Date Time Provider Department Center  02/05/2021 10:50 AM Cheral Marker, CNM [facility]    No orders of the defined types were placed in this encounter.  Cheral Marker CNM, [facility] 01/29/2021 12:02 PM

## 2021-02-05 ENCOUNTER — Encounter (HOSPITAL_COMMUNITY): Payer: Self-pay | Admitting: Obstetrics and Gynecology

## 2021-02-05 ENCOUNTER — Inpatient Hospital Stay (HOSPITAL_COMMUNITY): Payer: 59 | Admitting: Anesthesiology

## 2021-02-05 ENCOUNTER — Other Ambulatory Visit: Payer: Self-pay

## 2021-02-05 ENCOUNTER — Encounter: Payer: 59 | Admitting: Women's Health

## 2021-02-05 ENCOUNTER — Inpatient Hospital Stay (HOSPITAL_COMMUNITY)
Admission: AD | Admit: 2021-02-05 | Discharge: 2021-02-06 | DRG: 807 | Disposition: A | Discharge status: Home / Self Care | Payer: 59 | Attending: Family Medicine | Admitting: Family Medicine

## 2021-02-05 DIAGNOSIS — Z3480 Encounter for supervision of other normal pregnancy, unspecified trimester: Secondary | ICD-10-CM | POA: Insufficient documentation

## 2021-02-05 DIAGNOSIS — R87619 Unspecified abnormal cytological findings in specimens from cervix uteri: Secondary | ICD-10-CM | POA: Diagnosis present

## 2021-02-05 DIAGNOSIS — Z3490 Encounter for supervision of normal pregnancy, unspecified, unspecified trimester: Secondary | ICD-10-CM

## 2021-02-05 DIAGNOSIS — O26893 Other specified pregnancy related conditions, third trimester: Secondary | ICD-10-CM | POA: Diagnosis present

## 2021-02-05 DIAGNOSIS — Z3A39 39 weeks gestation of pregnancy: Secondary | ICD-10-CM | POA: Diagnosis not present

## 2021-02-05 DIAGNOSIS — Z20822 Contact with and (suspected) exposure to covid-19: Secondary | ICD-10-CM | POA: Diagnosis present

## 2021-02-05 DIAGNOSIS — O09529 Supervision of elderly multigravida, unspecified trimester: Secondary | ICD-10-CM

## 2021-02-05 LAB — CBC
HCT: 37.7 % (ref 36.0–46.0)
Hemoglobin: 12.2 g/dL (ref 12.0–15.0)
MCH: 26.3 pg (ref 26.0–34.0)
MCHC: 32.4 g/dL (ref 30.0–36.0)
MCV: 81.3 fL (ref 80.0–100.0)
Platelets: 318 10*3/uL (ref 150–400)
RBC: 4.64 MIL/uL (ref 3.87–5.11)
RDW: 14.9 % (ref 11.5–15.5)
WBC: 16.3 10*3/uL — ABNORMAL HIGH (ref 4.0–10.5)
nRBC: 0 % (ref 0.0–0.2)

## 2021-02-05 LAB — TYPE AND SCREEN
ABO/RH(D): A POS
Antibody Screen: NEGATIVE

## 2021-02-05 LAB — RESP PANEL BY RT-PCR (FLU A&B, COVID) ARPGX2
Influenza A by PCR: NEGATIVE
Influenza B by PCR: NEGATIVE
SARS Coronavirus 2 by RT PCR: NEGATIVE

## 2021-02-05 LAB — RPR: RPR Ser Ql: NONREACTIVE

## 2021-02-05 MED ORDER — TETANUS-DIPHTH-ACELL PERTUSSIS 5-2.5-18.5 LF-MCG/0.5 IM SUSY: 0.5000 mL | PREFILLED_SYRINGE | Freq: Once | INTRAMUSCULAR | Status: DC

## 2021-02-05 MED ORDER — OXYCODONE HCL 5 MG PO TABS: 5.0000 mg | ORAL_TABLET | ORAL | Status: DC | PRN

## 2021-02-05 MED ORDER — SOD CITRATE-CITRIC ACID 500-334 MG/5ML PO SOLN: 30.0000 mL | ORAL | Status: DC | PRN

## 2021-02-05 MED ORDER — LIDOCAINE HCL (PF) 1 % IJ SOLN: 30.0000 mL | INTRAMUSCULAR | Status: DC | PRN

## 2021-02-05 MED ORDER — ONDANSETRON HCL 4 MG/2ML IJ SOLN: 4.0000 mg | Freq: Four times a day (QID) | INTRAMUSCULAR | Status: DC | PRN

## 2021-02-05 MED ORDER — LIDOCAINE HCL (PF) 1 % IJ SOLN
INTRAMUSCULAR | Status: DC | PRN
  Administered 2021-02-05 (×2): 4 mL via EPIDURAL

## 2021-02-05 MED ORDER — PHENYLEPHRINE 40 MCG/ML (10ML) SYRINGE FOR IV PUSH (FOR BLOOD PRESSURE SUPPORT): 80.0000 ug | PREFILLED_SYRINGE | INTRAVENOUS | Status: DC | PRN

## 2021-02-05 MED ORDER — ACETAMINOPHEN 325 MG PO TABS
650.0000 mg | ORAL_TABLET | ORAL | Status: DC | PRN
  Administered 2021-02-05: 650 mg via ORAL
  Filled 2021-02-05: qty 2

## 2021-02-05 MED ORDER — OXYTOCIN-SODIUM CHLORIDE 30-0.9 UT/500ML-% IV SOLN
1.0000 m[IU]/min | INTRAVENOUS | Status: DC
  Administered 2021-02-05: 2 m[IU]/min via INTRAVENOUS

## 2021-02-05 MED ORDER — DIPHENHYDRAMINE HCL 50 MG/ML IJ SOLN: 12.5000 mg | INTRAMUSCULAR | Status: DC | PRN

## 2021-02-05 MED ORDER — SENNOSIDES-DOCUSATE SODIUM 8.6-50 MG PO TABS
2.0000 | ORAL_TABLET | Freq: Every day | ORAL | Status: DC
  Administered 2021-02-06: 2 via ORAL
  Filled 2021-02-05: qty 2

## 2021-02-05 MED ORDER — SIMETHICONE 80 MG PO CHEW: 80.0000 mg | CHEWABLE_TABLET | ORAL | Status: DC | PRN

## 2021-02-05 MED ORDER — EPHEDRINE 5 MG/ML INJ: 10.0000 mg | INTRAVENOUS | Status: DC | PRN

## 2021-02-05 MED ORDER — TERBUTALINE SULFATE 1 MG/ML IJ SOLN: 0.2500 mg | Freq: Once | INTRAMUSCULAR | Status: DC | PRN

## 2021-02-05 MED ORDER — LACTATED RINGERS IV SOLN: 500.0000 mL | INTRAVENOUS | Status: DC | PRN

## 2021-02-05 MED ORDER — COCONUT OIL OIL: 1.0000 "application " | TOPICAL_OIL | Status: DC | PRN

## 2021-02-05 MED ORDER — ONDANSETRON HCL 4 MG PO TABS: 4.0000 mg | ORAL_TABLET | ORAL | Status: DC | PRN

## 2021-02-05 MED ORDER — ZOLPIDEM TARTRATE 5 MG PO TABS: 5.0000 mg | ORAL_TABLET | Freq: Every evening | ORAL | Status: DC | PRN

## 2021-02-05 MED ORDER — LACTATED RINGERS IV SOLN: INTRAVENOUS | Status: DC

## 2021-02-05 MED ORDER — IBUPROFEN 600 MG PO TABS
600.0000 mg | ORAL_TABLET | Freq: Four times a day (QID) | ORAL | Status: DC
  Administered 2021-02-05 – 2021-02-06 (×3): 600 mg via ORAL
  Filled 2021-02-05 (×3): qty 1

## 2021-02-05 MED ORDER — OXYTOCIN-SODIUM CHLORIDE 30-0.9 UT/500ML-% IV SOLN
2.5000 [IU]/h | INTRAVENOUS | Status: DC
  Filled 2021-02-05: qty 500

## 2021-02-05 MED ORDER — LACTATED RINGERS IV SOLN: 500.0000 mL | Freq: Once | INTRAVENOUS | Status: DC

## 2021-02-05 MED ORDER — BENZOCAINE-MENTHOL 20-0.5 % EX AERO: 1.0000 "application " | INHALATION_SPRAY | CUTANEOUS | Status: DC | PRN

## 2021-02-05 MED ORDER — DIPHENHYDRAMINE HCL 25 MG PO CAPS: 25.0000 mg | ORAL_CAPSULE | Freq: Four times a day (QID) | ORAL | Status: DC | PRN

## 2021-02-05 MED ORDER — OXYCODONE-ACETAMINOPHEN 5-325 MG PO TABS: 1.0000 | ORAL_TABLET | ORAL | Status: DC | PRN

## 2021-02-05 MED ORDER — LACTATED RINGERS IV SOLN
500.0000 mL | Freq: Once | INTRAVENOUS | Status: AC
  Administered 2021-02-05: 500 mL via INTRAVENOUS

## 2021-02-05 MED ORDER — OXYCODONE-ACETAMINOPHEN 5-325 MG PO TABS: 2.0000 | ORAL_TABLET | ORAL | Status: DC | PRN

## 2021-02-05 MED ORDER — FENTANYL-BUPIVACAINE-NACL 0.5-0.125-0.9 MG/250ML-% EP SOLN
12.0000 mL/h | EPIDURAL | Status: DC | PRN
  Administered 2021-02-05: 12 mL/h via EPIDURAL

## 2021-02-05 MED ORDER — PRENATAL MULTIVITAMIN CH
1.0000 | ORAL_TABLET | Freq: Every day | ORAL | Status: DC
  Administered 2021-02-06: 1 via ORAL
  Filled 2021-02-05: qty 1

## 2021-02-05 MED ORDER — WITCH HAZEL-GLYCERIN EX PADS: 1.0000 "application " | MEDICATED_PAD | CUTANEOUS | Status: DC | PRN

## 2021-02-05 MED ORDER — DIBUCAINE (PERIANAL) 1 % EX OINT: 1.0000 "application " | TOPICAL_OINTMENT | CUTANEOUS | Status: DC | PRN

## 2021-02-05 MED ORDER — OXYTOCIN BOLUS FROM INFUSION: 333.0000 mL | Freq: Once | INTRAVENOUS | Status: DC

## 2021-02-05 MED ORDER — ONDANSETRON HCL 4 MG/2ML IJ SOLN: 4.0000 mg | INTRAMUSCULAR | Status: DC | PRN

## 2021-02-05 NOTE — Discharge Summary (Signed)
Postpartum Discharge Summary  Date of Service updated     Patient Name: Alyssa Hodge DOB: 16-Nov-1980 MRN: [id number]  Date of admission: 02/05/2021 Delivery date:02/05/2021  Delivering provider: WHITE, Ubaldo Glassing D  Date of discharge: 02/06/2021  Admitting diagnosis: Supervision of other normal pregnancy, antepartum [Z34.80] Intrauterine pregnancy: [redacted]w[redacted]d    Secondary diagnosis:  Active Problems:   Encounter for supervision of normal pregnancy, antepartum   Abnormal Pap smear of cervix   AMA (advanced maternal age) multigravida 35+  Additional problems: none    Discharge diagnosis: Term Pregnancy Delivered                                              Post partum procedures: none Augmentation: AROM and Pitocin Complications: None  Hospital course: Onset of Labor With Vaginal Delivery      40y.o. yo [id number] 38w2das admitted in Active Labor on 02/05/2021. Patient had an uncomplicated labor course as follows:  Membrane Rupture Time/Date: 10:52 AM ,02/05/2021   Delivery Method:Vaginal, Spontaneous  Episiotomy: None  Lacerations:  None  Patient had an uncomplicated postpartum course.  She is ambulating, tolerating a regular diet, passing flatus, and urinating well. Patient is discharged home in stable condition on 02/06/21.  Newborn Data: Birth date:02/05/2021  Birth time:4:31 PM  Gender:Female  Living status:Living  Apgars:8 ,9  Weight:3430 g (7lb 9oz)  Magnesium Sulfate received: No BMZ received: No Rhophylac:N/A MMR:N/A T-DaP:Given prenatally Flu: Yes Transfusion:No  Physical exam  Vitals:   02/05/21 2345 02/06/21 0300 02/06/21 0500 02/06/21 1447  BP: 101/69 103/67 99/67 112/71  Pulse: 70 73 67 77  Resp: '16 16 16 16  ' Temp: 98.2 F (36.8 C) 98 F (36.7 C) 98 F (36.7 C) 97.8 F (36.6 C)  TempSrc: Oral Oral Oral Oral  SpO2: 100% 98% 100% 100%  Weight:      Height:       General: alert, cooperative, and no distress Lochia: appropriate Uterine Fundus:  firm Incision: N/A DVT Evaluation: No evidence of DVT seen on physical exam. No significant calf/ankle edema. Labs: Lab Results  Component Value Date   WBC 16.3 (H) 02/05/2021   HGB 12.2 02/05/2021   HCT 37.7 02/05/2021   MCV 81.3 02/05/2021   PLT 318 02/05/2021   CMP Latest Ref Rng & Units 05/22/2020  Glucose 65 - 139 mg/dL 63(L)  BUN 7 - 25 mg/dL 15  Creatinine 0.50 - 1.10 mg/dL 0.62  Sodium 135 - 146 mmol/L 138  Potassium 3.5 - 5.3 mmol/L 4.0  Chloride 98 - 110 mmol/L 105  CO2 20 - 32 mmol/L 24  Calcium 8.6 - 10.2 mg/dL 9.2  Total Protein 6.1 - 8.1 g/dL 7.4  Total Bilirubin 0.2 - 1.2 mg/dL 0.4  Alkaline Phos 38 - 126 U/L -  AST 10 - 30 U/L 13  ALT 6 - 29 U/L 7   Edinburgh Score: Edinburgh Postnatal Depression Scale Screening Tool 02/06/2021  I have been able to laugh and see the funny side of things. 0  I have looked forward with enjoyment to things. 0  I have blamed myself unnecessarily when things went wrong. 0  I have been anxious or worried for no good reason. 0  I have felt scared or panicky for no good reason. 0  Things have been getting on top of me. 1  I have been  so unhappy that I have had difficulty sleeping. 0  I have felt sad or miserable. 0  I have been so unhappy that I have been crying. 0  The thought of harming myself has occurred to me. 0  Edinburgh Postnatal Depression Scale Total 1     After visit meds:  Allergies as of 02/06/2021   No Known Allergies      Medication List     TAKE these medications    acetaminophen 325 MG tablet Commonly known as: Tylenol Take 2 tablets (650 mg total) by mouth every 4 (four) hours as needed (for pain scale < 4).   ibuprofen 600 MG tablet Commonly known as: ADVIL Take 1 tablet (600 mg total) by mouth every 6 (six) hours.   omeprazole 20 MG capsule Commonly known as: PRILOSEC Take 1 capsule (20 mg total) by mouth daily. 1 tablet a day   PRENATAL VITAMIN PO Take by mouth.         Discharge  home in stable condition Infant Feeding: Breast Infant Disposition:home with mother Discharge instruction: per After Visit Summary and Postpartum booklet. Activity: Advance as tolerated. Pelvic rest for 6 weeks.  Diet: routine diet Future Appointments: Future Appointments  Date Time Provider [place]  02/07/2021  2:10 PM Roma Schanz, CNM [facility]   Follow up Visit:  Myrtis Ser, CNM  [place] Please schedule this patient for Postpartum visit in: 6 weeks with the following provider: Any provider  In-Person  For C/S patients schedule nurse incision check in weeks 2 weeks: no  Low risk pregnancy complicated by: none  Delivery mode:  SVD  Anticipated Birth Control:  IUD  PP Procedures needed: none  Schedule Integrated BH visit: no   02/06/2021 Patriciaann Clan, DO

## 2021-02-05 NOTE — Lactation Note (Signed)
This note was copied from a baby's chart. Lactation Consultation Note  Patient Name: Alyssa Hodge [id number] Date: 02/05/2021 Reason for consult: L&D Initial assessment;Mother's request;Term;Breastfeeding assistance Age:38 hours  LC assisted with latching infant in cross cradle on left breast with signs of milk transfer. Mom denied any pain with the latch.  Mom to receive further LC support on the floor.   Maternal Data Does the patient have breastfeeding experience prior to this delivery?: Yes How long did the patient breastfeed?: breast and bottle ( formula) for 1 year  Feeding Mother's Current Feeding Choice: Breast Milk and Formula  LATCH Score Latch: Repeated attempts needed to sustain latch, nipple held in mouth throughout feeding, stimulation needed to elicit sucking reflex.  Audible Swallowing: Spontaneous and intermittent  Type of Nipple: Everted at rest and after stimulation  Comfort (Breast/Nipple): Soft / non-tender  Hold (Positioning): Assistance needed to correctly position infant at breast and maintain latch.  LATCH Score: 8   Lactation Tools Discussed/Used    Interventions Interventions: Adjust position;Breast feeding basics reviewed;Assisted with latch;Support pillows;Skin to skin;Position options;Education;Breast massage;Expressed milk;Hand express;Breast compression  Discharge    Consult Status Consult Status: Follow-up from L&D Date: 02/06/21 Follow-up type: In-patient    Alyssa Billiter  Hodge 02/05/2021, 5:16 PM

## 2021-02-05 NOTE — Anesthesia Procedure Notes (Signed)
Epidural Patient location during procedure: OB Start time: 02/05/2021 6:58 AM End time: 02/05/2021 7:01 AM  Staffing Anesthesiologist: Kaylyn Layer, MD Performed: anesthesiologist   Preanesthetic Checklist Completed: patient identified, IV checked, risks and benefits discussed, monitors and equipment checked, pre-op evaluation and timeout performed  Epidural Patient position: sitting Prep: DuraPrep and site prepped and draped Patient monitoring: continuous pulse ox, blood pressure and heart rate Approach: midline Location: L3-L4 Injection technique: LOR air  Needle:  Needle type: Tuohy  Needle gauge: 17 G Needle length: 9 cm Catheter type: closed end flexible Catheter size: 19 Gauge Catheter at skin depth: 9 cm Test dose: negative and Other (1% lidocaine)  Assessment Events: blood not aspirated, injection not painful, no injection resistance, no paresthesia and negative IV test  Additional Notes Patient identified. Risks, benefits, and alternatives discussed with patient including but not limited to bleeding, infection, nerve damage, paralysis, failed block, incomplete pain control, headache, blood pressure changes, nausea, vomiting, reactions to medication, itching, and postpartum back pain. Confirmed with bedside nurse the patient's most recent platelet count. Confirmed with patient that they are not currently taking any anticoagulation, have any bleeding history, or any family history of bleeding disorders. Patient expressed understanding and wished to proceed. All questions were answered. Sterile technique was used throughout the entire procedure. Please see nursing notes for vital signs.   Crisp LOR on first pass. Test dose was given through epidural catheter and negative prior to continuing to dose epidural or start infusion. Warning signs of high block given to the patient including shortness of breath, tingling/numbness in hands, complete motor block, or any concerning  symptoms with instructions to call for help. Patient was given instructions on fall risk and not to get out of bed. All questions and concerns addressed with instructions to call with any issues or inadequate analgesia.  Reason for block:procedure for pain

## 2021-02-05 NOTE — Anesthesia Preprocedure Evaluation (Addendum)
Anesthesia Evaluation  Patient identified by MRN, date of birth, ID band Patient awake    Reviewed: Allergy & Precautions, Patient's Chart, lab work & pertinent test results  History of Anesthesia Complications Negative for: history of anesthetic complications  Airway Mallampati: II       Dental no notable dental hx.    Pulmonary neg pulmonary ROS,    Pulmonary exam normal        Cardiovascular negative cardio ROS Normal cardiovascular exam     Neuro/Psych negative neurological ROS  negative psych ROS   GI/Hepatic negative GI ROS, Neg liver ROS,   Endo/Other  negative endocrine ROS  Renal/GU negative Renal ROS  negative genitourinary   Musculoskeletal negative musculoskeletal ROS (+)   Abdominal   Peds  Hematology negative hematology ROS (+)   Anesthesia Other Findings   Reproductive/Obstetrics (+) Pregnancy                            Anesthesia Physical Anesthesia Plan  ASA: 2  Anesthesia Plan: Epidural   Post-op Pain Management:    Induction:   PONV Risk Score and Plan: Treatment may vary due to age or medical condition  Airway Management Planned: Natural Airway  Additional Equipment:   Intra-op Plan:   Post-operative Plan:   Informed Consent: I have reviewed the patients History and Physical, chart, labs and discussed the procedure including the risks, benefits and alternatives for the proposed anesthesia with the patient or authorized representative who has indicated his/her understanding and acceptance.       Plan Discussed with: Anesthesiologist  Anesthesia Plan Comments:        Anesthesia Quick Evaluation

## 2021-02-05 NOTE — Progress Notes (Signed)
S: Patient comfortable in bed with epidural, but tearful because she miss her family that's not in the country. Talked to her husband and childcare has been arranged and he should be headed here soon. Says she will feel a little better when he gets here. Patient alright with the plan for AROM. Anticipating the arrival of baby "Yannis".   O: Vitals:   02/05/21 0901 02/05/21 0931 02/05/21 1000 02/05/21 1100  BP: (!) 101/57 107/60 108/62 118/78  Pulse: 70 72 73 81  Resp: 18     Temp:      TempSrc:      SpO2:      Weight:      Height:        FHT:  FHR: 125 bpm, variability: moderate,  accelerations:  Present,  decelerations:  Absent UC:   regular, every 2-5 minutes SVE:   Dilation: 7 Effacement (%): 90 Station: -2 Exam by:: Lima, rn  A / P: Spontaneous labor, progressing normally AROM for labor progress GBS negative Fetal Wellbeing:  Category I Pain Control:  Epidural Anticipated MOD:  NSVD  Trellis Moment, SNM 02/05/2021, 10:55 AM

## 2021-02-05 NOTE — H&P (Signed)
OBSTETRIC ADMISSION HISTORY AND PHYSICAL  Alyssa Hodge is a 40 y.o. female G3P1011 with IUP at [redacted]w[redacted]d by LMP c/w 8w [place] presenting for labor. She reports +FMs, No LOF, no VB, no blurry vision, headaches or peripheral edema, and RUQ pain.  She plans on breast and bottle feeding. She request outpatient IUD for birth control. She received her prenatal care at [facility]   Dating: By LMP c/w 8w [place] --->  Estimated Date of Delivery: 02/10/21  Sono:    @[redacted]w[redacted]d , CWD, normal anatomy, transverse presentation, 392g, 99.7% EFW   Prenatal History/Complications:    Past Medical History: Past Medical History:  Diagnosis Date   Medical history non-contributory     Past Surgical History: Past Surgical History:  Procedure Laterality Date   NO PAST SURGERIES      Obstetrical History: OB History     Gravida  3   Para  1   Term  1   Preterm      AB  1   Living  1      SAB  1   IAB      Ectopic      Multiple  0   Live Births  1           Social History Social History   Socioeconomic History   Marital status: Married    Spouse name:   Number of children: 1   Years of education: Not on file   Highest education level: Not on file  Occupational History   Not on file  Tobacco Use   Smoking status: Never   Smokeless tobacco: Never  Vaping Use   Vaping Use: Never used  Substance and Sexual Activity   Alcohol use: Never   Drug use: Never   Sexual activity: Yes    Birth control/protection: None  Other Topics Concern   Not on file  Social History Narrative   Not on file   Social Determinants of Health   Financial Resource Strain: Not on file  Food Insecurity: Not on file  Transportation Needs: Not on file  Physical Activity: Not on file  Stress: Not on file  Social Connections: Not on file    Family History: Family History  Problem Relation Age of Onset   Cancer Paternal Grandfather    Hypertension Maternal Grandmother    Heart disease  Maternal Grandfather    Hypertension Father    Hypertension Mother    Heart disease Mother     Allergies: No Known Allergies  Medications Prior to Admission  Medication Sig Dispense Refill Last Dose   Prenatal Vit-Fe Fumarate-FA (PRENATAL VITAMIN PO) Take by mouth.   02/04/2021   Doxylamine Succinate, Sleep, (UNISOM PO) Take by mouth. (Patient not taking: Reported on 12/11/2020)      Doxylamine-Pyridoxine (DICLEGIS) 10-10 MG TBEC Take 2 qhs; may also take one in am and one in afternoon prn nausea (Patient not taking: Reported on 12/11/2020) 120 tablet 6    omeprazole (PRILOSEC) 20 MG capsule Take 1 capsule (20 mg total) by mouth daily. 1 tablet a day 30 capsule 6    ondansetron (ZOFRAN-ODT) 8 MG disintegrating tablet TAKE 1 TABLET BY MOUTH EVERY 8 HOURS AS NEEDED FOR NAUSEA OR VOMITING. (Patient not taking: Reported on 01/29/2021) 18 tablet 7      Review of Systems   All systems reviewed and negative except as stated in HPI  Blood pressure 106/60, pulse 70, temperature 98.3 F (36.8 C), temperature source Oral, resp.  rate 18, height 5\' 7"  (1.702 m), weight 73.5 kg, last menstrual period 05/06/2020, SpO2 100 %. General appearance: alert and cooperative Lungs: clear to auscultation bilaterally Heart: regular rate and rhythm Abdomen: soft, non-tender; bowel sounds normal Extremities: Homans sign is negative, no sign of DVT Presentation: cephalic Fetal monitoringBaseline: 130 bpm, Variability: Good {> 6 bpm), Accelerations: Reactive, and Decelerations: Absent Uterine activity: every 2-4 min Dilation: 6 Effacement (%): 90 Station: -2 Exam by:: Lima, rn   Prenatal labs: ABO, Rh: --/--/A POS (12/06 09-20-1985) Antibody: NEG (12/06 09-20-1985) Rubella: 19.80 (06/09 1457) RPR: Non Reactive (09/13 0932)  HBsAg: Negative (06/09 1457)  HIV: Non Reactive (09/13 0932)  GBS: Negative/-- (11/22 1520)  1 hr Glucola Normal Genetic screening  AFP neg. Panorama LR. Anatomy 09-09-2002 normal.   Prenatal  Transfer Tool  Maternal Diabetes: No Genetic Screening: Normal Maternal Ultrasounds/Referrals: Normal Fetal Ultrasounds or other Referrals:  None Maternal Substance Abuse:  No Significant Maternal Medications:  None Significant Maternal Lab Results: None  Results for orders placed or performed during the hospital encounter of 02/05/21 (from the past 24 hour(s))  Resp Panel by RT-PCR (Flu A&B, Covid) Nasopharyngeal Swab   Collection Time: 02/05/21  6:13 AM   Specimen: Nasopharyngeal Swab; Nasopharyngeal(NP) swabs in vial transport medium  Result Value Ref Range   SARS Coronavirus 2 by RT PCR NEGATIVE NEGATIVE   Influenza A by PCR NEGATIVE NEGATIVE   Influenza B by PCR NEGATIVE NEGATIVE  CBC   Collection Time: 02/05/21  6:13 AM  Result Value Ref Range   WBC 16.3 (H) 4.0 - 10.5 K/uL   RBC 4.64 3.87 - 5.11 MIL/uL   Hemoglobin 12.2 12.0 - 15.0 g/dL   HCT 14/06/22 61.6 - 07.3 %   MCV 81.3 80.0 - 100.0 fL   MCH 26.3 26.0 - 34.0 pg   MCHC 32.4 30.0 - 36.0 g/dL   RDW 71.0 62.6 - 94.8 %   Platelets 318 150 - 400 K/uL   nRBC 0.0 0.0 - 0.2 %  Type and screen [facility]   Collection Time: 02/05/21  6:13 AM  Result Value Ref Range   ABO/RH(D) A POS    Antibody Screen NEG    Sample Expiration      02/08/2021,2359 Performed at [facility], [address], [place], [place]     Patient Active Problem List   Diagnosis Date Noted   Supervision of other normal pregnancy, antepartum 02/05/2021   Abnormal Pap smear of cervix 09/21/2020   Asymptomatic bacteriuria during pregnancy in first trimester 08/14/2020   Encounter for supervision of normal pregnancy, antepartum 07/31/2020    Assessment/Plan:  Alyssa Hodge is a 40 y.o. G3P1011 at [redacted]w[redacted]d here for active labor  #Labor:cervix changed from 4 cm to 6 cm. Plan for expectantly manage #Pain: Plans epidural #FWB: Cat I #ID:  Negative GBS. #MOF: Both breast and bottle #MOC: oupatient IUD  #Circ: plans  outpatient at her   [redacted]w[redacted]d, MD, MPH OB Fellow, Faculty Practice [facility], [facility] 02/05/2021 9:10 AM

## 2021-02-05 NOTE — Progress Notes (Signed)
S:  Patient resting comfortable in bed after epidural. Husband coming later after childcare is arranged for son. Anticipating the arrival of baby "Yannis".   O: Vitals:   02/05/21 0759 02/05/21 0800 02/05/21 0831 02/05/21 0901  BP:  112/65 106/60 (!) 101/57  Pulse:  78 70 70  Resp:   18 18  Temp: 98.3 F (36.8 C)     TempSrc: Oral     SpO2:      Weight:      Height:        FHT:  FHR: 125 bpm, variability: moderate,  accelerations:  Present,  decelerations:  Absent UC:   irregular, every 1-8 minutes SVE:   Dilation: 6 Effacement (%): 90 Station: -2 Exam by:: Lima, rn  A / P: Spontaneous labor, progressing normally expectant management.  AROM at next cervical exam Fetal Wellbeing:  Category I Pain Control:  Epidural Anticipated MOD:  NSVD  Trellis Moment, SNM 02/05/2021, 09:45 AM

## 2021-02-05 NOTE — MAU Note (Signed)
Pt is requesting an epidural.

## 2021-02-05 NOTE — MAU Note (Signed)
Alyssa Hodge is a 40 y.o. at [redacted]w[redacted]d here in MAU reporting: reports contractions that began between 0300-0400 this morning. Pt denies SROM, vaginal bleeding or bloody show. Pt denies complications or problems with pregnancy.   Vitals:   02/05/21 0547  BP: 129/79  Pulse: 87  Resp: 17  Temp: (!) 97.3 F (36.3 C)  SpO2: 100%     FHT:153 Lab orders placed from triage:  MAU labor

## 2021-02-05 NOTE — Progress Notes (Signed)
S: Resting comfortable in bed with epidural. No complaints at this time. Husband, Khaled at bedside and supportive.Patient feels a lot better and and in better spirits now that her husband is here.   O: Vitals:   02/05/21 1301 02/05/21 1335 02/05/21 1400 02/05/21 1430  BP: (!) 105/57 107/60 (!) 105/56 (!) 108/54  Pulse: 78 84 77 76  Resp:  16  18  Temp:   98.5 F (36.9 C)   TempSrc:   Oral   SpO2:      Weight:      Height:        FHT:  FHR: 140 bpm, variability: moderate,  accelerations:  Present,  decelerations:  Absent UC:   regular, every 2-5 minutes SVE:   Dilation: 8 Effacement (%): 80 Station: -1 Exam by:: Lima, rn  A / P: Spontaneous labor, progressing normally, expectant management  Active phase of labor GBS negative Fetal Wellbeing:  Category I Pain Control:  Epidural Anticipated MOD:  NSVD  Trellis Moment, SNM 02/05/2021, 3:02 PM

## 2021-02-06 MED ORDER — ACETAMINOPHEN 325 MG PO TABS: 650.0000 mg | ORAL_TABLET | ORAL | Status: DC | PRN

## 2021-02-06 MED ORDER — IBUPROFEN 600 MG PO TABS: 600.0000 mg | ORAL_TABLET | Freq: Four times a day (QID) | ORAL | 0 refills | Status: DC

## 2021-02-06 NOTE — Anesthesia Postprocedure Evaluation (Signed)
Anesthesia Post Note  Patient: Alyssa Hodge  Procedure(s) Performed: AN AD HOC LABOR EPIDURAL     Patient location during evaluation: Mother Baby Anesthesia Type: Epidural Level of consciousness: awake and alert Pain management: pain level controlled Vital Signs Assessment: post-procedure vital signs reviewed and stable Respiratory status: spontaneous breathing, nonlabored ventilation and respiratory function stable Cardiovascular status: stable Postop Assessment: no headache, no backache and epidural receding Anesthetic complications: no   No notable events documented.  Last Vitals:  Vitals:   02/06/21 0300 02/06/21 0500  BP: 103/67 99/67  Pulse: 73 67  Resp: 16 16  Temp: 36.7 C 36.7 C  SpO2: 98% 100%    Last Pain:  Vitals:   02/06/21 0500  TempSrc: Oral  PainSc: 2    Pain Goal: Patients Stated Pain Goal: 3 (02/06/21 0500)                 Emmaline Kluver N

## 2021-02-06 NOTE — Progress Notes (Signed)
Patient ID: Khiya Friese, female   DOB: February 05, 1981, 40 y.o.   MRN: [id number] POSTPARTUM PROGRESS NOTE  Post Partum Day 1  Subjective:  Americus Scheurich is a 40 y.o. [id number] s/p SVD at [redacted]w[redacted]d.  She reports she is doing well. No acute events overnight. She denies any problems with ambulating, voiding or po intake. Denies nausea or vomiting.  Pain is well controlled.  Lochia is normal.  Objective: Blood pressure 99/67, pulse 67, temperature 98 F (36.7 C), temperature source Oral, resp. rate 16, height 5\' 7"  (1.702 m), weight 73.5 kg, last menstrual period 05/06/2020, SpO2 100 %, unknown if currently breastfeeding.  Physical Exam:  General: alert, cooperative and no distress Chest: no respiratory distress Heart:regular rate, distal pulses intact Abdomen: soft, nontender,  Uterine Fundus: firm, appropriately tender DVT Evaluation: No calf swelling or tenderness Extremities: No edema Skin: warm, dry  Recent Labs    02/05/21 0613  HGB 12.2  HCT 37.7    Assessment/Plan: Briget Shaheed is a 40 y.o. 20 s/p SVD at [redacted]w[redacted]d   PPD#1 - Doing well  Routine postpartum care  Contraception: Outpatient IUD Feeding: Both breast and bottle Dispo: Plan for discharge 1 day.   LOS: 1 day   [redacted]w[redacted]d, MS3 02/06/2021, 5:30 AM

## 2021-02-07 ENCOUNTER — Encounter: Payer: 59 | Admitting: Women's Health

## 2021-02-19 ENCOUNTER — Other Ambulatory Visit: Payer: Self-pay | Admitting: Family Medicine

## 2021-02-19 ENCOUNTER — Telehealth (HOSPITAL_COMMUNITY): Payer: Self-pay | Admitting: *Deleted

## 2021-02-19 NOTE — Telephone Encounter (Signed)
Attempted hospital discharge follow-up call with [address], [place] [phone]. No answer received. Deforest Hoyles, 02/19/21, [phone]

## 2021-03-11 ENCOUNTER — Other Ambulatory Visit: Payer: Self-pay | Admitting: Family Medicine

## 2021-03-12 ENCOUNTER — Ambulatory Visit: Payer: Medicaid Other | Admitting: Women's Health

## 2021-03-19 ENCOUNTER — Ambulatory Visit: Payer: Medicaid Other | Admitting: Women's Health

## 2021-03-25 ENCOUNTER — Encounter: Payer: Self-pay | Admitting: Women's Health

## 2021-03-25 ENCOUNTER — Other Ambulatory Visit: Payer: Self-pay

## 2021-03-25 ENCOUNTER — Ambulatory Visit (INDEPENDENT_AMBULATORY_CARE_PROVIDER_SITE_OTHER): Payer: Medicaid Other | Admitting: Women's Health

## 2021-03-25 DIAGNOSIS — Z3009 Encounter for other general counseling and advice on contraception: Secondary | ICD-10-CM | POA: Diagnosis not present

## 2021-03-25 DIAGNOSIS — Z3202 Encounter for pregnancy test, result negative: Secondary | ICD-10-CM | POA: Diagnosis not present

## 2021-03-25 LAB — POCT URINE PREGNANCY: Preg Test, Ur: NEGATIVE

## 2021-03-25 NOTE — Patient Instructions (Signed)
NO SEX UNTIL AFTER YOU GET YOUR BIRTH CONTROL  

## 2021-03-25 NOTE — Progress Notes (Signed)
POSTPARTUM VISIT Patient name: Alyssa Hodge MRN [id number]  Date of birth: Jun 25, 1980 Chief Complaint:   Postpartum Care  History of Present Illness:   Alyssa Hodge is a 41 y.o. G15P2012 female being seen today for a postpartum visit. She is 6 weeks postpartum following a spontaneous vaginal delivery at 39.2 gestational weeks. IOL: no, for n/a. Anesthesia: epidural.  Laceration: none.  Complications: none. Inpatient contraception: no.   Pregnancy uncomplicated. Tobacco use: no. Substance use disorder: no. Last pap smear: 09/18/20 and results were ASCUS w/ HRHPV positive: other (not 16, 18/45). Next pap smear due: July 2023 No LMP recorded.  Postpartum course has been uncomplicated. Bleeding none. Bowel function is normal. Bladder function is normal. Urinary incontinence? no, fecal incontinence? no Patient is sexually active. Last sexual activity:  last night . Desired contraception: IUD. Patient does not know want a pregnancy in the future.  Desired family size is uncertain #of children.   Upstream - 03/25/21 1020       Pregnancy Intention Screening   Does the patient want to become pregnant in the next year? No    Does the patient's partner want to become pregnant in the next year? No    Would the patient like to discuss contraceptive options today? Yes      Contraception Wrap Up   Current Method Abstinence    End Method IUD or IUS    Contraception Counseling Provided Yes            The pregnancy intention screening data noted above was reviewed. Potential methods of contraception were discussed. The patient elected to proceed with IUD or IUS.  Edinburgh Postpartum Depression Screening: negative  Edinburgh Postnatal Depression Scale - 03/25/21 1019       Edinburgh Postnatal Depression Scale:  In the Past 7 Days   I have been able to laugh and see the funny side of things. 0    I have looked forward with enjoyment to things. 0    I have blamed myself unnecessarily when things  went wrong. 0    I have been anxious or worried for no good reason. 0    I have felt scared or panicky for no good reason. 1    Things have been getting on top of me. 1    I have been so unhappy that I have had difficulty sleeping. 0    I have felt sad or miserable. 0    I have been so unhappy that I have been crying. 1    The thought of harming myself has occurred to me. 0    Edinburgh Postnatal Depression Scale Total 3             GAD 7 : Generalized Anxiety Score 08/09/2020  Nervous, Anxious, on Edge 0  Control/stop worrying 0  Worry too much - different things 0  Trouble relaxing 0  Restless 0  Easily annoyed or irritable 0  Afraid - awful might happen 0  Total GAD 7 Score 0     Baby's course has been uncomplicated. Baby is feeding by bottle. Infant has a pediatrician/family doctor? Yes.  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  Pt has material needs met for her and baby: Yes.   Review of Systems:   Pertinent items are noted in HPI Denies Abnormal vaginal discharge w/ itching/odor/irritation, headaches, visual changes, shortness of breath, chest pain, abdominal pain, severe nausea/vomiting, or problems with urination or bowel movements. Pertinent History Reviewed:  Reviewed past medical,surgical, obstetrical and family history.  Reviewed problem list, medications and allergies. OB History  Gravida Para Term Preterm AB Living  '3 2 2   1 2  ' SAB IAB Ectopic Multiple Live Births  1     0 2    # Outcome Date GA Lbr Len/2nd Weight Sex Delivery Anes PTL Lv  3 Term 02/05/21 [id number]:42 / 00:19 7 lb 9 oz (3.43 kg) M Vag-Spont EPI  LIV  2 SAB 02/24/20 167w4d       1 Term 12/22/18 [id number] 6 lb 8.1 oz (2.951 kg) M Vag-Spont EPI  LIV   Physical Assessment:   Vitals:   03/25/21 1023  BP: 136/84  Pulse: 90  Weight: 149 lb (67.6 kg)  Height: '5\' 7"'  (1.702 m)  Body mass index is 23.34 kg/m.       Physical Examination:   General appearance:  alert, well appearing, and in no distress  Mental status: alert, oriented to person, place, and time  Skin: warm & dry   Cardiovascular: normal heart rate noted   Respiratory: normal respiratory effort, no distress   Breasts: deferred, no complaints   Abdomen: soft, non-tender   Pelvic: examination not indicated. Thin prep pap obtained: No  Rectal: not examined  Extremities: Edema: none   Chaperone: N/A         Results for orders placed or performed in visit on 03/25/21 (from the past 24 hour(s))  POCT urine pregnancy   Collection Time: 03/25/21 10:31 AM  Result Value Ref Range   Preg Test, Ur Negative Negative    Assessment & Plan:  1) Postpartum exam 2) 5 wks s/p spontaneous vaginal delivery 3) bottle feeding 4) Depression screening 5) Contraception counseling> abstinence until after IUD insertion 2/6  Essential components of care per ACOG recommendations:  1.  Mood and well being:  If positive depression screen, discussed and plan developed.  If using tobacco we discussed reduction/cessation and risk of relapse If current substance abuse, we discussed and referral to local resources was offered.   2. Infant care and feeding:  If breastfeeding, discussed returning to work, pumping, breastfeeding-associated pain, guidance regarding return to fertility while lactating if not using another method. If needed, patient was provided with a letter to be allowed to pump q 2-3hrs to support lactation in a private location with access to a refrigerator to store breastmilk.   Recommended that all caregivers be immunized for flu, pertussis and other preventable communicable diseases If pt does not have material needs met for her/baby, referred to local resources for help obtaining these.  3. Sexuality, contraception and birth spacing Provided guidance regarding sexuality, management of dyspareunia, and resumption of intercourse Discussed avoiding interpregnancy interval <[id number]  recommended birth spacing of 18 months  4. Sleep and fatigue Discussed coping options for fatigue and sleep disruption Encouraged family/partner/community support of 4 hrs of uninterrupted sleep to help with mood and fatigue  5. Physical recovery  If pt had a C/S, assessed incisional pain and providing guidance on normal vs prolonged recovery If pt had a laceration, perineal healing and pain reviewed.  If urinary or fecal incontinence, discussed management and referred to PT or uro/gyn if indicated  Patient is safe to resume physical activity. Discussed attainment of healthy weight.  6.  Chronic disease management Discussed pregnancy complications if any, and their implications for future childbearing and long-term maternal health. Review recommendations for prevention of recurrent pregnancy complications, such as 17  hydroxyprogesterone caproate to reduce risk for recurrent PTB not applicable, or aspirin to reduce risk of preeclampsia not applicable. Pt had GDM: no. If yes, 2hr GTT scheduled: not applicable. Reviewed medications and non-pregnant dosing including consideration of whether pt is breastfeeding using a reliable resource such as LactMed: not applicable Referred for f/u w/ PCP or subspecialist providers as indicated: not applicable  7. Health maintenance Mammogram at 39yo or earlier if indicated Pap smears as indicated  Meds: No orders of the defined types were placed in this encounter.   Follow-up: Return for 2/6 for , IUD insertion.   Orders Placed This Encounter  Procedures   POCT urine pregnancy    Roma Schanz CNM, [facility] 03/25/2021 10:49 AM

## 2021-04-08 ENCOUNTER — Ambulatory Visit (INDEPENDENT_AMBULATORY_CARE_PROVIDER_SITE_OTHER): Payer: Medicaid Other | Admitting: Obstetrics & Gynecology

## 2021-04-08 ENCOUNTER — Other Ambulatory Visit: Payer: Self-pay

## 2021-04-08 ENCOUNTER — Encounter: Payer: Self-pay | Admitting: Obstetrics & Gynecology

## 2021-04-08 VITALS — BP 124/82 | HR 89 | Ht 67.72 in | Wt 148.4 lb

## 2021-04-08 DIAGNOSIS — Z3202 Encounter for pregnancy test, result negative: Secondary | ICD-10-CM | POA: Diagnosis not present

## 2021-04-08 DIAGNOSIS — Z30430 Encounter for insertion of intrauterine contraceptive device: Secondary | ICD-10-CM | POA: Diagnosis not present

## 2021-04-08 LAB — POCT URINE PREGNANCY: Preg Test, Ur: NEGATIVE

## 2021-04-08 MED ORDER — LEVONORGESTREL 13.5 MG IU IUD: INTRAUTERINE_SYSTEM | Freq: Once | INTRAUTERINE | Status: AC

## 2021-04-08 NOTE — Addendum Note (Signed)
Addended by: Leilani Able, Khalea Ventura A on: 04/08/2021 12:36 PM   Modules accepted: Orders

## 2021-04-08 NOTE — Progress Notes (Signed)
° °  IUD INSERTION Patient name: Seleste Tallman MRN [id number]  Date of birth: [birth year] Subjective Findings:   Minh Jasper is a 41 y.o. G13P2012  female being seen today for insertion of a Skyla IUD.  Patient's last menstrual period was 03/27/2021 (approximate). Last sexual intercourse was 2wks ago Last pap: July 2022. Results were:  ASCUS, HPV pos  Colpo completed- will need repeat Pap July 2023 Reviewed contraceptive/IUD options- discussed each IUD- desires Skyla.  The risks and benefits of the method and placement have been thouroughly reviewed with the patient and all questions were answered.  Specifically the patient is aware of failure rate of 03/998, expulsion of the IUD and of possible perforation.  The patient is aware of irregular bleeding due to the method and understands the incidence of irregular bleeding diminishes with time.  Signed copy of informed consent in chart.   Depression screen [facility] 2/9 08/09/2020 06/15/2018 05/11/2018  Decreased Interest 0 1 0  Down, Depressed, Hopeless 0 0 0  PHQ - 2 Score 0 1 0  Altered sleeping 0 1 -  Tired, decreased energy 1 1 -  Change in appetite 1 3 -  Feeling bad or failure about yourself  0 1 -  Trouble concentrating 0 0 -  Moving slowly or fidgety/restless 0 0 -  Suicidal thoughts 0 0 -  PHQ-9 Score 2 7 -     GAD 7 : Generalized Anxiety Score 08/09/2020  Nervous, Anxious, on Edge 0  Control/stop worrying 0  Worry too much - different things 0  Trouble relaxing 0  Restless 0  Easily annoyed or irritable 0  Afraid - awful might happen 0  Total GAD 7 Score 0     Pertinent History Reviewed:   Reviewed past medical,surgical, social, obstetrical and family history.  Reviewed problem list, medications and allergies. Objective Findings & Procedure:   Vitals:   04/08/21 1131  BP: 124/82  Pulse: 89  Weight: 148 lb 6.4 oz (67.3 kg)  Height: 5' 7.72" (1.72 m)  Body mass index is 22.75 kg/m.  Results for orders placed or performed in  visit on 04/08/21 (from the past 24 hour(s))  POCT urine pregnancy   Collection Time: 04/08/21 11:36 AM  Result Value Ref Range   Preg Test, Ur Negative Negative     Time out was performed.  A sterile speculum was placed in the vagina.  The cervix was visualized, prepped using Betadine, and grasped with a single tooth tenaculum. The uterus was found to be anteroflexed and it sounded to 8 cm.  Skyla  IUD placed per manufacturer's recommendations. The strings were trimmed to approximately 3 cm. The patient tolerated the procedure well.   Chaperone: Art gallery manager & Plan:   1) Skyla IUD insertion The patient was given post procedure instructions, including signs and symptoms of infection and to check for the strings after each menses or each month, and refraining from intercourse or anything in the vagina for 3 days. She was given a care card with date IUD placed, and date IUD to be removed. She is scheduled for a f/u appointment in 4-6 weeks.  Orders Placed This Encounter  Procedures   POCT urine pregnancy    Return in about 6 weeks (around 05/20/2021) for IUD check.  Myna Hidalgo, DO Attending Obstetrician & Gynecologist, [facility] for [employer], [facility]

## 2021-04-09 ENCOUNTER — Ambulatory Visit: Payer: Medicaid Other | Admitting: Women's Health

## 2021-05-04 ENCOUNTER — Other Ambulatory Visit: Payer: Self-pay | Admitting: Family Medicine

## 2021-05-21 ENCOUNTER — Ambulatory Visit: Payer: Medicaid Other | Admitting: Obstetrics & Gynecology

## 2021-05-21 ENCOUNTER — Encounter: Payer: Self-pay | Admitting: Obstetrics & Gynecology

## 2021-05-21 ENCOUNTER — Other Ambulatory Visit: Payer: Self-pay

## 2021-05-21 VITALS — BP 125/79 | HR 95 | Wt 148.8 lb

## 2021-05-21 DIAGNOSIS — Z30431 Encounter for routine checking of intrauterine contraceptive device: Secondary | ICD-10-CM

## 2021-05-21 DIAGNOSIS — N939 Abnormal uterine and vaginal bleeding, unspecified: Secondary | ICD-10-CM

## 2021-05-21 NOTE — Progress Notes (Signed)
? ?  GYN VISIT ?Patient name: Alyssa Hodge MRN [id number]  Date of birth: 07-May-1980 ?Chief Complaint:   ?IUD check ? ?History of Present Illness:   ?Alyssa Hodge is a 41 y.o. G35P2012  female being seen today for the following concerns: ? ?Skyla placed Feb 6- notes bleeding every day, but light.  Using small pantyliner about 3 per day. Occasional pain does not require medication. Overall the device is "ok"  She does not desire removal.  No other acute complaint ? ?No LMP recorded. (Menstrual status: IUD). ? ?Depression screen [facility] 2/9 08/09/2020 06/15/2018 05/11/2018  ?Decreased Interest 0 1 0  ?Down, Depressed, Hopeless 0 0 0  ?PHQ - 2 Score 0 1 0  ?Altered sleeping 0 1 -  ?Tired, decreased energy 1 1 -  ?Change in appetite 1 3 -  ?Feeling bad or failure about yourself  0 1 -  ?Trouble concentrating 0 0 -  ?Moving slowly or fidgety/restless 0 0 -  ?Suicidal thoughts 0 0 -  ?PHQ-9 Score 2 7 -  ? ? ? ?Review of Systems:   ?Pertinent items are noted in HPI ?Denies fever/chills, dizziness, headaches, visual disturbances, fatigue, shortness of breath, chest pain, abdominal pain, vomiting, see HPI regarding her menses, bowel movements, urination, or intercourse unless otherwise stated above.  ?Pertinent History Reviewed:  ?Reviewed past medical,surgical, social, obstetrical and family history.  ?Reviewed problem list, medications and allergies. ?Physical Assessment:  ? ?Vitals:  ? 05/21/21 1026  ?BP: 125/79  ?Pulse: 95  ?Weight: 148 lb 12.8 oz (67.5 kg)  ?Body mass index is 22.81 kg/m?. ? ?     Physical Examination:  ? General appearance: alert, well appearing, and in no distress ? Psych: mood appropriate, normal affect ? Skin: warm & dry  ? Cardiovascular: normal heart rate noted ? Respiratory: normal respiratory effort, no distress ? Abdomen: soft, non-tender  ? Pelvic: normal external genitalia, vulva, vagina, cervix, uterus and adnexa, minimal dark red blood noted in vault, strings visualized at cervical os ? Extremities: no  edema  ? ?Chaperone:  Dr. Robyne Peers    ? ?Assessment & Plan:  ?1) IUD check up ?-device in proper location ?-reassured pt that irregular bleeding is normal ?-f/U in July for annual- if irregular bleeding still noted at that time, may consider alternative options ? ? ?Return for July annual. ? ? ?Myna Hidalgo, DO ?Attending Obstetrician & Gynecologist, Faculty Practice ?Center for [employer], [facility] ? ? ? ?

## 2022-08-19 ENCOUNTER — Ambulatory Visit: Payer: Medicaid Other | Admitting: Adult Health

## 2022-08-19 ENCOUNTER — Other Ambulatory Visit (HOSPITAL_COMMUNITY)
Admission: RE | Admit: 2022-08-19 | Discharge: 2022-08-19 | Disposition: A | Discharge status: Home / Self Care | Payer: Medicaid Other | Source: Ambulatory Visit | Attending: Adult Health | Admitting: Adult Health

## 2022-08-19 ENCOUNTER — Encounter: Payer: Self-pay | Admitting: Adult Health

## 2022-08-19 VITALS — BP 101/75 | HR 100 | Ht 67.0 in | Wt 143.0 lb

## 2022-08-19 DIAGNOSIS — R87610 Atypical squamous cells of undetermined significance on cytologic smear of cervix (ASC-US): Secondary | ICD-10-CM | POA: Diagnosis not present

## 2022-08-19 DIAGNOSIS — Z124 Encounter for screening for malignant neoplasm of cervix: Secondary | ICD-10-CM | POA: Diagnosis present

## 2022-08-19 DIAGNOSIS — Z975 Presence of (intrauterine) contraceptive device: Secondary | ICD-10-CM | POA: Diagnosis not present

## 2022-08-19 DIAGNOSIS — N941 Unspecified dyspareunia: Secondary | ICD-10-CM

## 2022-08-19 DIAGNOSIS — N814 Uterovaginal prolapse, unspecified: Secondary | ICD-10-CM | POA: Diagnosis not present

## 2022-08-19 DIAGNOSIS — R5383 Other fatigue: Secondary | ICD-10-CM

## 2022-08-19 NOTE — Progress Notes (Signed)
  Subjective:     Patient ID: Alyssa Hodge, female   DOB: 27-Dec-1980, 42 y.o.   MRN: [id number]  HPI [name] is a 42 year old female, married, in complaining of feeling ball in vagina, pain with sex and feels tired. She needs a pap, last pap was ASCUS +HPV 09/18/20.   Review of Systems +feeling ball in vagina,  +pain with sex  feels tired Denies problems with urination or bowel movements     Reviewed past medical,surgical, social and family history. Reviewed medications and allergies.  Objective:   Physical Exam BP 101/75 (BP Location: Left Arm, Patient Position: Sitting, Cuff Size: Normal)   Pulse 100   Ht 5\' 7"  (1.702 m)   Wt 143 lb (64.9 kg)   Breastfeeding No   BMI 22.40 kg/m  Skin warm and dry.Pelvic: external genitalia is normal in appearance no lesions, vagina: pink and moist, +uterine descensus,urethra has no lesions or masses noted, cervix:smooth and bulbous, +IUD strings at os, pap with HR HPV genotyping performed,uterus: normal size, shape and contour, non tender, no masses felt, adnexa: no masses or tenderness noted. Bladder is non tender and no masses felt.    Fall risk is low  Upstream - 08/19/22 1400       Pregnancy Intention Screening   Does the patient want to become pregnant in the next year? No    Does the patient's partner want to become pregnant in the next year? No    Would the patient like to discuss contraceptive options today? No      Contraception Wrap Up   Current Method IUD or IUS    End Method IUD or IUS            Examination chaperoned by Malachy Mood LPN  Assessment:     1. Routine Papanicolaou smear Pap sent - Cytology - PAP( [place])  2. Uterine prolapse Has first degree uterine descensus Discussed can feel cervix easily, it has come down some in vagina  3. IUD (intrauterine device) in place Skyla placed 04/08/21  4. Atypical squamous cells of undetermined significance on cytologic smear of cervix (ASC-US) Pap sent  5.  Tired +feels tired  - CBC - Comprehensive metabolic panel - TSH + free T4  6. Dyspareunia, female Try different positions     Plan:     Follow up prn

## 2022-08-20 LAB — CBC
Hematocrit: 41.2 % (ref 34.0–46.6)
Hemoglobin: 13.8 g/dL (ref 11.1–15.9)
MCH: 28.6 pg (ref 26.6–33.0)
MCHC: 33.5 g/dL (ref 31.5–35.7)
MCV: 85 fL (ref 79–97)
Platelets: 365 10*3/uL (ref 150–450)
RBC: 4.83 x10E6/uL (ref 3.77–5.28)
RDW: 12.4 % (ref 11.7–15.4)
WBC: 10.9 10*3/uL — ABNORMAL HIGH (ref 3.4–10.8)

## 2022-08-20 LAB — COMPREHENSIVE METABOLIC PANEL
ALT: 11 IU/L (ref 0–32)
AST: 17 IU/L (ref 0–40)
Albumin: 4.5 g/dL (ref 3.9–4.9)
Alkaline Phosphatase: 58 IU/L (ref 44–121)
BUN/Creatinine Ratio: 11 (ref 9–23)
BUN: 7 mg/dL (ref 6–24)
Bilirubin Total: 0.3 mg/dL (ref 0.0–1.2)
CO2: 22 mmol/L (ref 20–29)
Calcium: 9.7 mg/dL (ref 8.7–10.2)
Chloride: 101 mmol/L (ref 96–106)
Creatinine, Ser: 0.65 mg/dL (ref 0.57–1.00)
Globulin, Total: 2.7 g/dL (ref 1.5–4.5)
Glucose: 98 mg/dL (ref 70–99)
Potassium: 4.4 mmol/L (ref 3.5–5.2)
Sodium: 140 mmol/L (ref 134–144)
Total Protein: 7.2 g/dL (ref 6.0–8.5)
eGFR: 114 mL/min/{1.73_m2} (ref >59)

## 2022-08-20 LAB — TSH+FREE T4
Free T4: 1.17 ng/dL (ref 0.82–1.77)
TSH: 2.55 u[IU]/mL (ref 0.450–4.500)

## 2022-08-26 LAB — CYTOLOGY - PAP
Comment: NEGATIVE
Comment: NEGATIVE
Comment: NEGATIVE
Diagnosis: NEGATIVE
HPV 16: NEGATIVE
HPV 18 / 45: NEGATIVE
High risk HPV: POSITIVE — AB

## 2022-11-12 IMAGING — US US OB < 14 WEEKS - US OB TV
1 series · 15 of 28 positions shown · non-contrast
Comparison: None.

CLINICAL DATA: Estimated gestational age by last menstrual period
equals 10 weeks 4 days. Ten weeks with bleeding.

EXAM:
OBSTETRIC <14 WK US AND TRANSVAGINAL OB
TECHNIQUE: Both transabdominal and transvaginal ultrasound examinations were
performed for complete evaluation of the gestation as well as the
maternal uterus, adnexal regions, and pelvic cul-de-sac.
Transvaginal technique was performed to assess early pregnancy.

[Series 1: us ob < 14 weeks - us ob tv · 37 acquisitions, 15 frames shown]
[im 1/37]
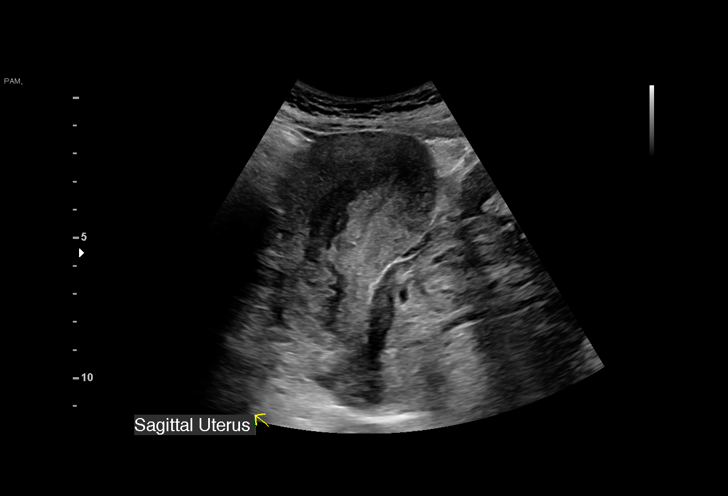
[im 3/37]
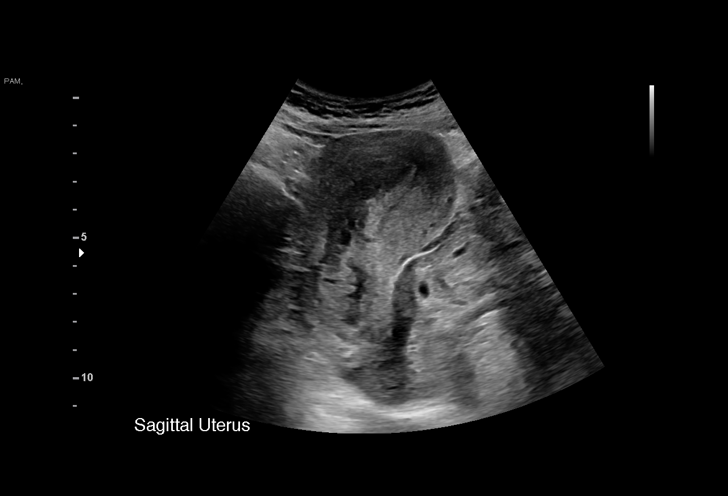
[im 6/37]
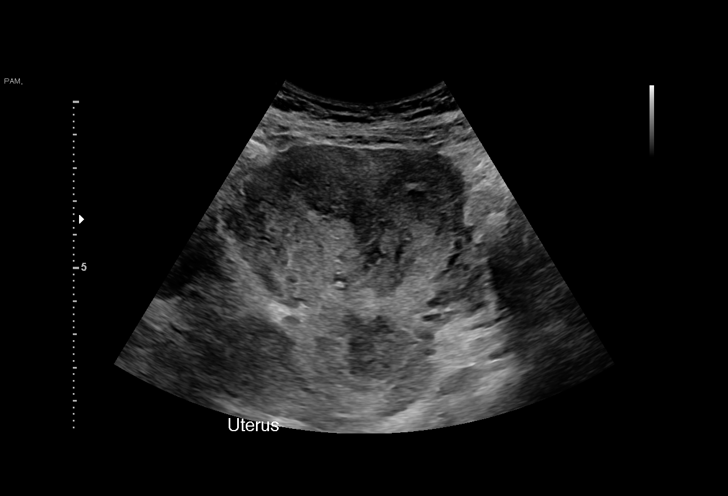
[im 9/37]
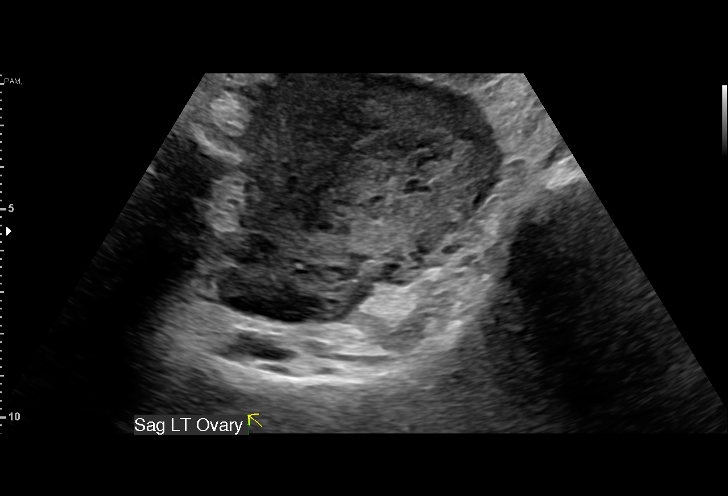
[im 11/37]
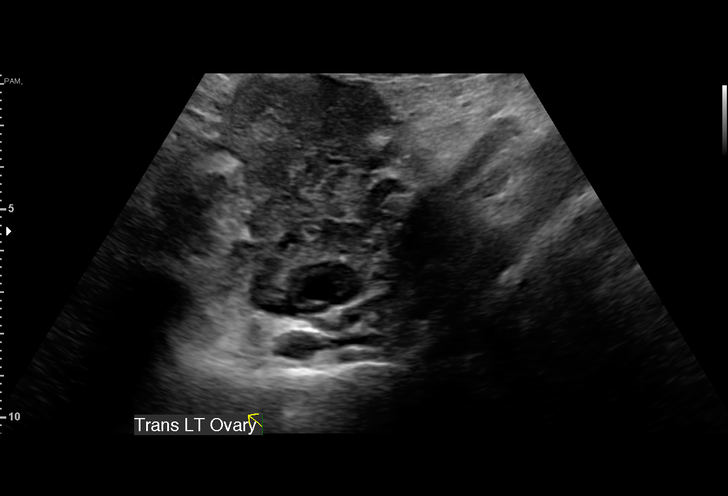
[im 14/37]
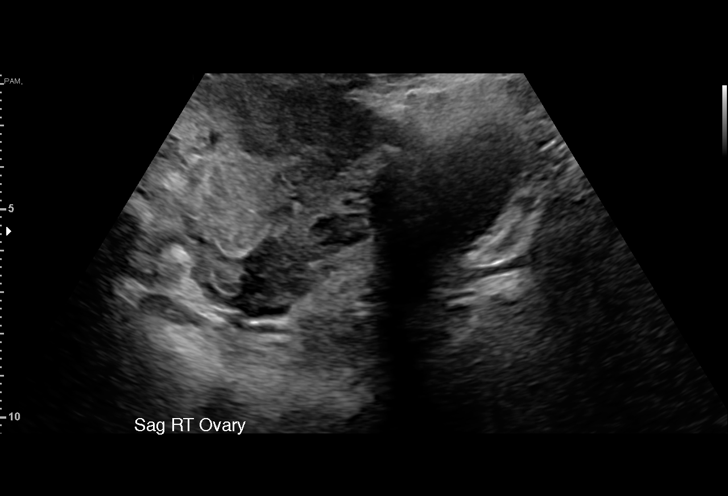
[im 17/37]
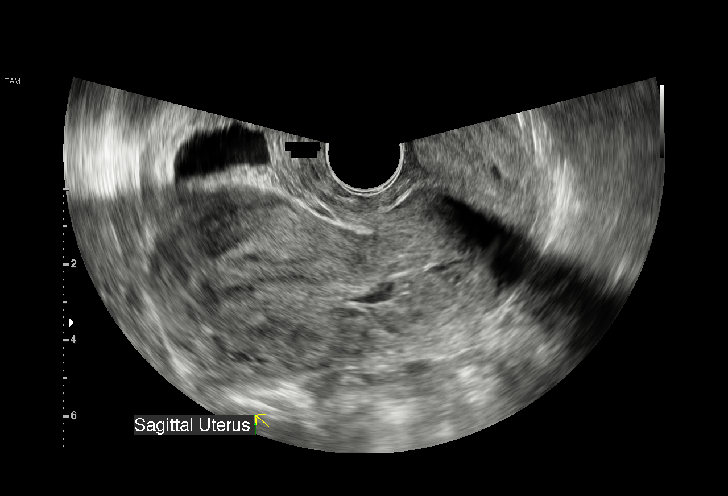
[im 19/37]
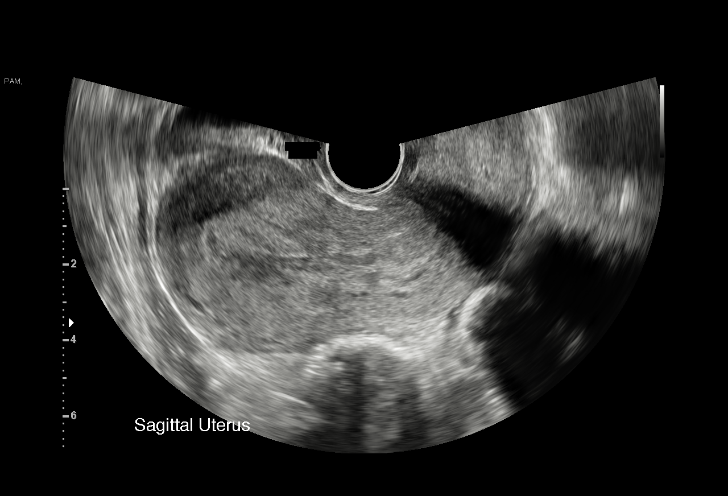
[im 21/37]
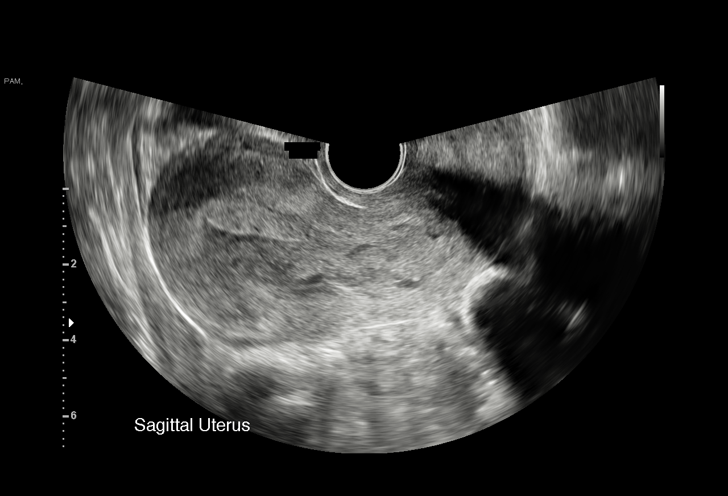
[im 23/37]
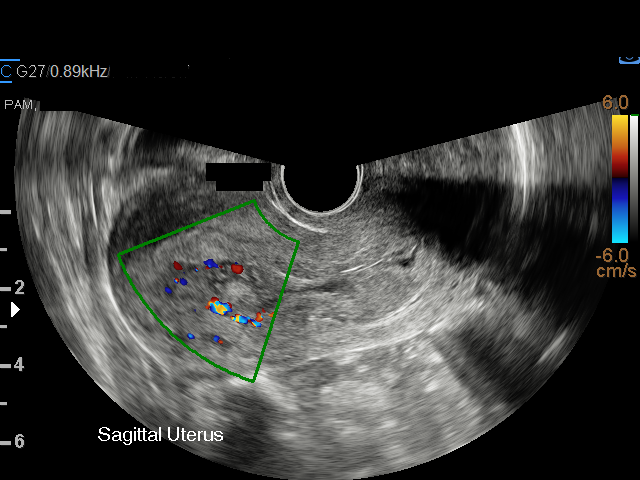
[im 26/37]
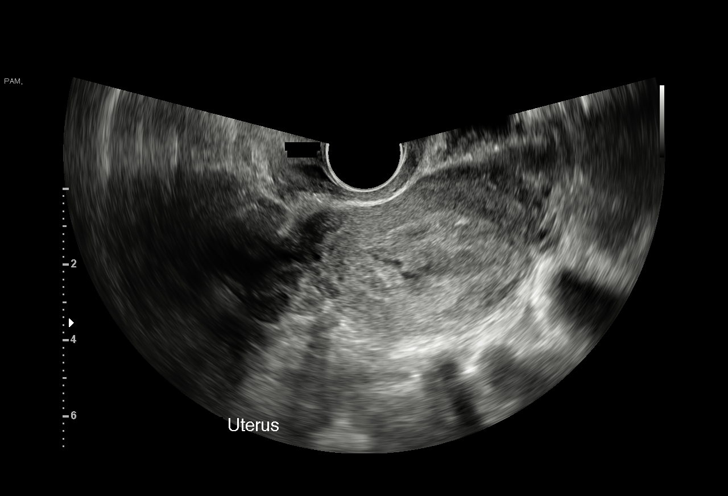
[im 29/37]
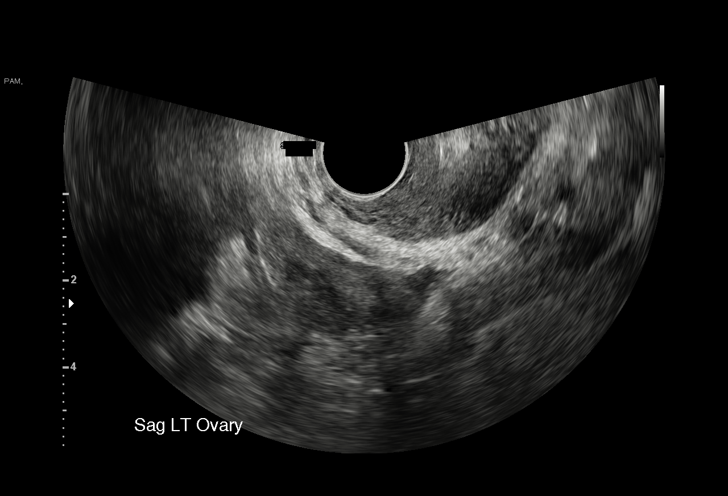
[im 31/37]
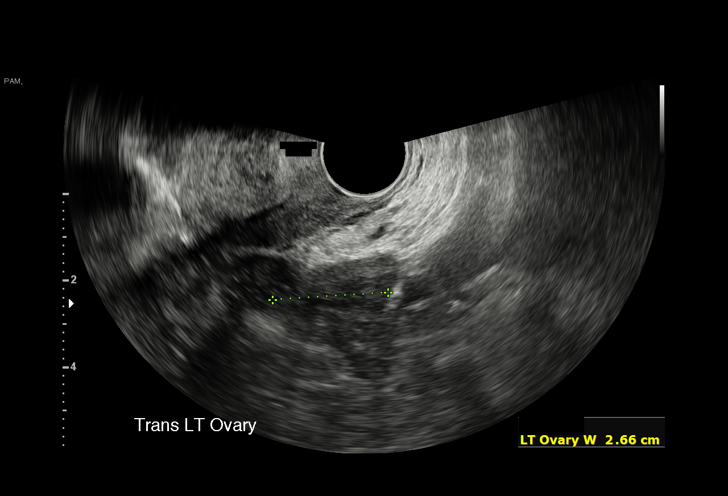
[im 34/37]
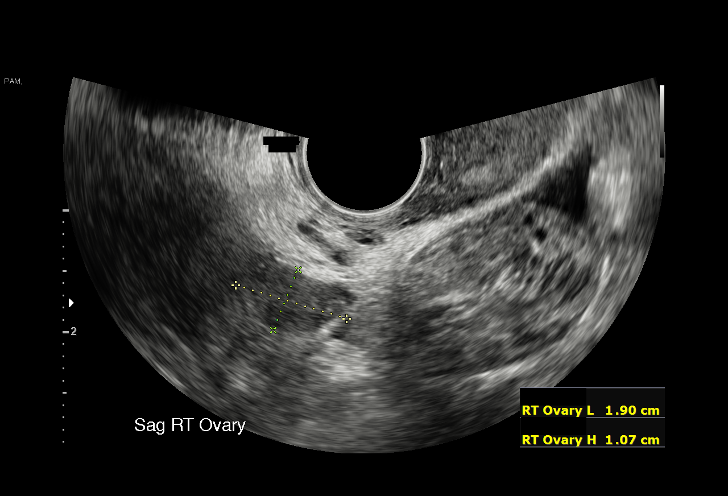
[im 37/37]
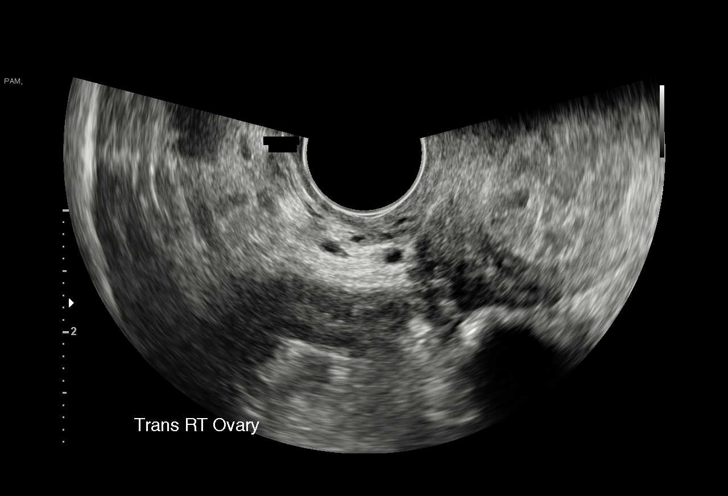

[15 of 28 positions shown; findings below may reference images not displayed]

FINDINGS: Intrauterine gestational sac: Absent

Yolk sac:  Absent

Embryo:  Absent

Subchorionic hemorrhage:  None visualized.

Maternal uterus/adnexae: Normal ovaries.  No free fluid

Echogenic material within the endometrium differential including
retained products of conception versus blood clot.
IMPRESSION: 1. No gestational sac, yolk sac or embryo. Findings most consistent
with spontaneous abortion in progress and in [REDACTED] weeks 4 days
estimated gestational age (LMP).
2. Echogenic material within the endometrial canal with differential
including blood clot versus retained products conception. Recommend
follow-up ultrasound 10-14 days to demonstrate resolution.

## 2023-07-02 ENCOUNTER — Encounter (HOSPITAL_COMMUNITY): Payer: Self-pay

## 2023-07-02 ENCOUNTER — Emergency Department (HOSPITAL_COMMUNITY)
Admission: EM | Admit: 2023-07-02 | Discharge: 2023-07-03 | Disposition: A | Discharge status: Home / Self Care | Attending: Emergency Medicine | Admitting: Emergency Medicine

## 2023-07-02 ENCOUNTER — Other Ambulatory Visit: Payer: Self-pay

## 2023-07-02 ENCOUNTER — Emergency Department (HOSPITAL_COMMUNITY)

## 2023-07-02 DIAGNOSIS — R1013 Epigastric pain: Secondary | ICD-10-CM | POA: Insufficient documentation

## 2023-07-02 DIAGNOSIS — R112 Nausea with vomiting, unspecified: Secondary | ICD-10-CM | POA: Insufficient documentation

## 2023-07-02 DIAGNOSIS — R109 Unspecified abdominal pain: Secondary | ICD-10-CM

## 2023-07-02 LAB — COMPREHENSIVE METABOLIC PANEL WITH GFR
ALT: 12 U/L (ref 0–44)
AST: 17 U/L (ref 15–41)
Albumin: 4.3 g/dL (ref 3.5–5.0)
Alkaline Phosphatase: 44 U/L (ref 38–126)
Anion gap: 9 (ref 5–15)
BUN: 13 mg/dL (ref 6–20)
CO2: 21 mmol/L — ABNORMAL LOW (ref 22–32)
Calcium: 9.2 mg/dL (ref 8.9–10.3)
Chloride: 103 mmol/L (ref 98–111)
Creatinine, Ser: 0.65 mg/dL (ref 0.44–1.00)
GFR, Estimated: >60 mL/min (ref >60)
Glucose, Bld: 100 mg/dL — ABNORMAL HIGH (ref 70–99)
Potassium: 3.2 mmol/L — ABNORMAL LOW (ref 3.5–5.1)
Sodium: 133 mmol/L — ABNORMAL LOW (ref 135–145)
Total Bilirubin: 0.4 mg/dL (ref 0.0–1.2)
Total Protein: 7.7 g/dL (ref 6.5–8.1)

## 2023-07-02 LAB — URINALYSIS, ROUTINE W REFLEX MICROSCOPIC
Bilirubin Urine: NEGATIVE
Glucose, UA: NEGATIVE mg/dL
Hgb urine dipstick: NEGATIVE
Ketones, ur: 20 mg/dL — AB
Leukocytes,Ua: NEGATIVE
Nitrite: NEGATIVE
Protein, ur: NEGATIVE mg/dL
Specific Gravity, Urine: 1.017 (ref 1.005–1.030)
pH: 5 (ref 5.0–8.0)

## 2023-07-02 LAB — CBC
HCT: 41 % (ref 36.0–46.0)
Hemoglobin: 13.5 g/dL (ref 12.0–15.0)
MCH: 28.8 pg (ref 26.0–34.0)
MCHC: 32.9 g/dL (ref 30.0–36.0)
MCV: 87.4 fL (ref 80.0–100.0)
Platelets: 346 10*3/uL (ref 150–400)
RBC: 4.69 MIL/uL (ref 3.87–5.11)
RDW: 12.1 % (ref 11.5–15.5)
WBC: 10.5 10*3/uL (ref 4.0–10.5)
nRBC: 0 % (ref 0.0–0.2)

## 2023-07-02 LAB — POC URINE PREG, ED: Preg Test, Ur: NEGATIVE

## 2023-07-02 LAB — LIPASE, BLOOD: Lipase: 35 U/L (ref 11–51)

## 2023-07-02 MED ORDER — PROCHLORPERAZINE EDISYLATE 10 MG/2ML IJ SOLN
10.0000 mg | Freq: Once | INTRAMUSCULAR | Status: AC
  Administered 2023-07-02: 10 mg via INTRAVENOUS
  Filled 2023-07-02: qty 2

## 2023-07-02 MED ORDER — IOHEXOL 300 MG/ML  SOLN
100.0000 mL | Freq: Once | INTRAMUSCULAR | Status: AC | PRN
  Administered 2023-07-02: 100 mL via INTRAVENOUS

## 2023-07-02 MED ORDER — KETOROLAC TROMETHAMINE 15 MG/ML IJ SOLN
15.0000 mg | Freq: Once | INTRAMUSCULAR | Status: AC
  Administered 2023-07-02: 15 mg via INTRAVENOUS
  Filled 2023-07-02: qty 1

## 2023-07-02 MED ORDER — DIPHENHYDRAMINE HCL 50 MG/ML IJ SOLN
25.0000 mg | Freq: Once | INTRAMUSCULAR | Status: AC
  Administered 2023-07-02: 25 mg via INTRAVENOUS
  Filled 2023-07-02: qty 1

## 2023-07-02 MED ORDER — FAMOTIDINE IN NACL 20-0.9 MG/50ML-% IV SOLN
20.0000 mg | Freq: Once | INTRAVENOUS | Status: AC
  Administered 2023-07-02: 20 mg via INTRAVENOUS
  Filled 2023-07-02: qty 50

## 2023-07-02 MED ORDER — SODIUM CHLORIDE 0.9 % IV BOLUS
1000.0000 mL | Freq: Once | INTRAVENOUS | Status: AC
  Administered 2023-07-02: 1000 mL via INTRAVENOUS

## 2023-07-02 NOTE — ED Provider Notes (Signed)
 [facility]-EMERGENCY DEPT [facility] Emergency Department Provider Note MRN:  [id number]  Arrival date & time: 07/03/23     Chief Complaint   Abdominal Pain   History of Present Illness   Alyssa Hodge is a 43 y.o. year-old female with no pertinent past medical history presenting to the ED with chief complaint of abdominal pain.  Epigastric abdominal pain intermittent over the past several months, becoming more frequent, more severe, now associated with nausea and vomiting.  Denies fever, no diarrhea, no constipation, no lower abdominal pain.  Review of Systems  A thorough review of systems was obtained and all systems are negative except as noted in the HPI and PMH.   Patient's Health History    Past Medical History:  Diagnosis Date   Medical history non-contributory     Past Surgical History:  Procedure Laterality Date   NO PAST SURGERIES      Family History  Problem Relation Age of Onset   Cancer Paternal Grandfather    Hypertension Maternal Grandmother    Heart disease Maternal Grandfather    Hypertension Father    Hypertension Mother    Heart disease Mother     Social History   Socioeconomic History   Marital status: Married    Spouse name: Arvil Birks   Number of children: 1   Years of education: Not on file   Highest education level: Not on file  Occupational History   Not on file  Tobacco Use   Smoking status: Never   Smokeless tobacco: Never  Vaping Use   Vaping status: Never Used  Substance and Sexual Activity   Alcohol use: Never   Drug use: Never   Sexual activity: Yes    Birth control/protection: I.U.D.  Other Topics Concern   Not on file  Social History Narrative   Not on file   Social Drivers of Health   Financial Resource Strain: Low Risk  (12/21/2018)   Overall Financial Resource Strain (CARDIA)    Difficulty of Paying Living Expenses: Not hard at all  Food Insecurity: No Food Insecurity (12/21/2018)   Hunger Vital Sign    Worried  About Running Out of Food in the Last Year: Never true    Ran Out of Food in the Last Year: Never true  Transportation Needs: No Transportation Needs (12/21/2018)   PRAPARE - Administrator, Civil Service (Medical): No    Lack of Transportation (Non-Medical): No  Physical Activity: Inactive (12/21/2018)   Exercise Vital Sign    Days of Exercise per Week: 0 days    Minutes of Exercise per Session: 0 min  Stress: No Stress Concern Present (12/21/2018)   [employer] of Occupational Health - Occupational Stress Questionnaire    Feeling of Stress : Not at all  Social Connections: Unknown (12/21/2018)   Social Connection and Isolation Panel [NHANES]    Frequency of Communication with Friends and Family: Three times a week    Frequency of Social Gatherings with Friends and Family: Three times a week    Attends Religious Services: Never    Active Member of Clubs or Organizations: No    Attends Banker Meetings: Never    Marital Status: Not on file  Intimate Partner Violence: Not At Risk (12/21/2018)   Humiliation, Afraid, Rape, and Kick questionnaire    Fear of Current or Ex-Partner: No    Emotionally Abused: No    Physically Abused: No    Sexually Abused: No  Physical Exam   Vitals:   07/02/23 2330 07/02/23 2345  BP: (!) 135/53 123/75  Pulse: 68 67  Resp:  12  Temp:    SpO2: 100% 100%    CONSTITUTIONAL: Well-appearing, NAD NEURO/PSYCH:  Alert and oriented x 3, no focal deficits EYES:  eyes equal and reactive ENT/NECK:  no LAD, no JVD CARDIO: Regular rate, well-perfused, normal S1 and S2 PULM:  CTAB no wheezing or rhonchi GI/GU:  non-distended, non-tender MSK/SPINE:  No gross deformities, no edema SKIN:  no rash, atraumatic   *Additional and/or pertinent findings included in MDM below  Diagnostic and Interventional Summary    EKG Interpretation Date/Time:    Ventricular Rate:    PR Interval:    QRS Duration:    QT Interval:    QTC  Calculation:   R Axis:      Text Interpretation:         Labs Reviewed  COMPREHENSIVE METABOLIC PANEL WITH GFR - Abnormal; Notable for the following components:      Result Value   Sodium 133 (*)    Potassium 3.2 (*)    CO2 21 (*)    Glucose, Bld 100 (*)    All other components within normal limits  URINALYSIS, ROUTINE W REFLEX MICROSCOPIC - Abnormal; Notable for the following components:   APPearance HAZY (*)    Ketones, ur 20 (*)    All other components within normal limits  POC URINE PREG, ED - Normal  LIPASE, BLOOD  CBC    CT ABDOMEN PELVIS W CONTRAST  Final Result      Medications  diphenhydrAMINE  (BENADRYL ) injection 25 mg (25 mg Intravenous Given 07/02/23 2324)  prochlorperazine  (COMPAZINE ) injection 10 mg (10 mg Intravenous Given 07/02/23 2325)  famotidine  (PEPCID ) IVPB 20 mg premix (20 mg Intravenous New Bag/Given 07/02/23 2335)  ketorolac  (TORADOL ) 15 MG/ML injection 15 mg (15 mg Intravenous Given 07/02/23 2324)  sodium chloride  0.9 % bolus 1,000 mL (1,000 mLs Intravenous New Bag/Given 07/02/23 2323)  iohexol  (OMNIPAQUE ) 300 MG/ML solution 100 mL (100 mLs Intravenous Contrast Given 07/02/23 2356)     Procedures  /  Critical Care Procedures  ED Course and Medical Decision Making  Initial Impression and Ddx Differential diagnosis includes GERD, pancreatitis, cholelithiasis, cholecystitis.  Neoplasm considered given duration and worsening.  Awaiting CT.  Past medical/surgical history that increases complexity of ED encounter: None  Interpretation of Diagnostics I personally reviewed the Laboratory Testing and my interpretation is as follows: No significant blood count or electrolyte disturbance.  CT with some retained stool, otherwise unremarkable.  Patient Reassessment and Ultimate Disposition/Management     Well-appearing again on reassessment with normal vitals, feeling a lot better, appropriate for discharge.  Patient management required discussion with the  following services or consulting groups:  None  Complexity of Problems Addressed Acute illness or injury that poses threat of life of bodily function  Additional Data Reviewed and Analyzed Further history obtained from: Prior labs/imaging results  Additional Factors Impacting ED Encounter Risk Prescriptions and Consideration of hospitalization  Merrick Abe. Harless Lien, MD [facility] Emergency Medicine [facility] [email or web address]  Final Clinical Impressions(s) / ED Diagnoses     ICD-10-CM   1. Abdominal pain, unspecified abdominal location  R10.9       ED Discharge Orders          Ordered    omeprazole  (PRILOSEC) 20 MG capsule  Daily        07/03/23 0124    polyethylene glycol (MIRALAX)  17 g packet  Daily        07/03/23 0124             Discharge Instructions Discussed with and Provided to Patient:     Discharge Instructions      You were evaluated in the Emergency Department and after careful evaluation, we did not find any emergent condition requiring admission or further testing in the hospital.  Your exam/testing today is overall reassuring.  Symptoms may be due to acid reflux, you may also have constipation.  Use the omeprazole  medication daily to treat for acid reflux, use the MiraLAX as needed to have more frequent stools.  Follow-up with your primary care doctor.  Please return to the Emergency Department if you experience any worsening of your condition.   Thank you for allowing us  to be a part of your care.       Edson Graces, MD 07/03/23 [phone]

## 2023-07-02 NOTE — ED Triage Notes (Signed)
 Pt c/o abdominal pain for "long time" that happens around twice a week. Pain is in middle of belly. Pt states that her appetite has decreased and eating causes more pain.

## 2023-07-03 MED ORDER — OMEPRAZOLE 20 MG PO CPDR: 20.0000 mg | DELAYED_RELEASE_CAPSULE | Freq: Every day | ORAL | 1 refills | Status: AC

## 2023-07-03 MED ORDER — POLYETHYLENE GLYCOL 3350 17 G PO PACK: 17.0000 g | PACK | Freq: Every day | ORAL | 1 refills | Status: DC

## 2023-07-03 NOTE — ED Notes (Signed)
 Patient's symptoms improved. Patient wanting an update, message sent to MD.

## 2023-07-03 NOTE — Discharge Instructions (Signed)
 You were evaluated in the Emergency Department and after careful evaluation, we did not find any emergent condition requiring admission or further testing in the hospital.  Your exam/testing today is overall reassuring.  Symptoms may be due to acid reflux, you may also have constipation.  Use the omeprazole  medication daily to treat for acid reflux, use the MiraLAX as needed to have more frequent stools.  Follow-up with your primary care doctor.  Please return to the Emergency Department if you experience any worsening of your condition.   Thank you for allowing us  to be a part of your care.

## 2023-07-06 ENCOUNTER — Other Ambulatory Visit (HOSPITAL_COMMUNITY)
Admission: RE | Admit: 2023-07-06 | Discharge: 2023-07-06 | Disposition: A | Discharge status: Home / Self Care | Source: Ambulatory Visit | Attending: Obstetrics and Gynecology | Admitting: Obstetrics and Gynecology

## 2023-07-06 ENCOUNTER — Ambulatory Visit: Admitting: Obstetrics and Gynecology

## 2023-07-06 VITALS — BP 136/81 | HR 92 | Ht 67.0 in | Wt 140.0 lb

## 2023-07-06 DIAGNOSIS — Z30432 Encounter for removal of intrauterine contraceptive device: Secondary | ICD-10-CM

## 2023-07-06 DIAGNOSIS — R1013 Epigastric pain: Secondary | ICD-10-CM | POA: Diagnosis present

## 2023-07-06 DIAGNOSIS — Z8669 Personal history of other diseases of the nervous system and sense organs: Secondary | ICD-10-CM | POA: Diagnosis not present

## 2023-07-06 NOTE — Progress Notes (Signed)
   GYNECOLOGY PROGRESS NOTE  History:  43 y.o. [id number] presents to [facility].  office today for problem gyn visit.Seen in ED 5/1 for abdominal pain. Has been ongoing for several months, reports pain is worsening. Reports migraine is worsening and nausea  Reports the pain weekly, severe headache, severe stomach pain, cannot keep down food or drink when experiencing pain. Reports that her period pain is getting worse. Denies constipation. Reports burning epigastric pain. Reports experiencing this pain since IUD but getting worse. REports pain once month with period   The following portions of the patient's history were reviewed and updated as appropriate: allergies, current medications, past family history, past medical history, past social history, past surgical history and problem list. Last pap smear on 08/19/2022 NILM, +HPV  Health Maintenance Due  Topic Date Due   COVID-19 Vaccine (3 - 2024-25 season) 11/02/2022     Review of Systems:  Pertinent items are noted in HPI.   Objective:  Physical Exam Blood pressure 136/81, pulse 92, height 5\' 7"  (1.702 m), weight 140 lb (63.5 kg). VS reviewed, nursing note reviewed,  Constitutional: well developed, well nourished, no distress HEENT: normocephalic CV: normal rate Pulm/chest wall: normal effort Breast Exam: deferred Abdomen: soft, nontender Neuro: alert and oriented  Skin: warm, dry Psych: affect normal Pelvic exam: Cervix pink, visually closed, without lesion, scant white creamy discharge, vaginal walls and external genitalia normal, IUD strings visualized    GYNECOLOGY OFFICE PROCEDURE NOTE  IUD Removal  Patient identified, informed consent performed, consent signed.  Patient was in the dorsal lithotomy position, normal external genitalia was noted.  A speculum was placed in the patient's vagina, normal discharge was noted, no lesions. The cervix was visualized, no lesions, no abnormal discharge.  The strings of the IUD were  grasped and pulled using ring forceps. The IUD was removed in its entirety. Patient tolerated the procedure well.    Patient will use condoms for contraception  Routine preventative health maintenance measures emphasized.  Assessment & Plan:  1. Epigastric pain (Primary) CT IMPRESSION: 1. Nonobstructive 3 mm right nephrolithiasis. 2. Stool throughout the colon-correlate for constipation. 3. T-shaped intrauterine device in appropriate position. 4.  Aortic Atherosclerosis (ICD10-I70.0). Was given prescription at hospital for omeprazole , encouraged daily use Discussed normal IUD position, differentials for symptoms, check swab today Desires IUD removal today, discussed options such as replacing IUD, copper IUD, implant, injection, pills. Would like to wait 2-3 months before replace  - Cervicovaginal ancillary only  2. Encounter for IUD removal   3. History of migraine  Return in two months for annual exam   Susi Eric, FNP

## 2023-07-06 NOTE — Patient Instructions (Signed)
 [email or web address]

## 2023-07-07 ENCOUNTER — Other Ambulatory Visit: Payer: Self-pay | Admitting: Obstetrics and Gynecology

## 2023-07-07 LAB — CERVICOVAGINAL ANCILLARY ONLY
Bacterial Vaginitis (gardnerella): NEGATIVE
Candida Glabrata: NEGATIVE
Candida Vaginitis: POSITIVE — AB
Chlamydia: NEGATIVE
Comment: NEGATIVE
Comment: NEGATIVE
Comment: NEGATIVE
Comment: NEGATIVE
Comment: NEGATIVE
Comment: NORMAL
Neisseria Gonorrhea: NEGATIVE
Trichomonas: NEGATIVE

## 2023-07-07 MED ORDER — FLUCONAZOLE 150 MG PO TABS: 150.0000 mg | ORAL_TABLET | Freq: Once | ORAL | 1 refills | Status: AC

## 2023-08-04 ENCOUNTER — Ambulatory Visit: Admitting: Obstetrics and Gynecology

## 2023-08-04 VITALS — BP 111/73 | HR 77 | Ht 67.0 in | Wt 140.0 lb

## 2023-08-04 DIAGNOSIS — Z30013 Encounter for initial prescription of injectable contraceptive: Secondary | ICD-10-CM | POA: Diagnosis not present

## 2023-08-04 DIAGNOSIS — Z3042 Encounter for surveillance of injectable contraceptive: Secondary | ICD-10-CM

## 2023-08-04 MED ORDER — MEDROXYPROGESTERONE ACETATE 150 MG/ML IM SUSY
150.0000 mg | PREFILLED_SYRINGE | Freq: Once | INTRAMUSCULAR | Status: AC
  Administered 2023-08-04: 150 mg via INTRAMUSCULAR

## 2023-08-04 MED ORDER — MEDROXYPROGESTERONE ACETATE 150 MG/ML IM SUSY: 150.0000 mg | PREFILLED_SYRINGE | INTRAMUSCULAR | 3 refills | Status: DC

## 2023-08-04 NOTE — Progress Notes (Signed)
   GYNECOLOGY PROGRESS NOTE  History:  43 y.o. [id number] presents to [facility] office today for birth control. She would like to start depo provera . IUD removed 07/06/23. Has been considering her options and would like IUD.  The following portions of the patient's history were reviewed and updated as appropriate: allergies, current medications, past family history, past medical history, past social history, past surgical history and problem list. Last pap smear on 08/19/22 was NILM, HPV pos  Health Maintenance Due  Topic Date Due   COVID-19 Vaccine (3 - 2024-25 season) 11/02/2022     Review of Systems:  Pertinent items are noted in HPI.   Objective:  Physical Exam Blood pressure 111/73, pulse 77, height 5\' 7"  (1.702 m), weight 140 lb (63.5 kg). VS reviewed, nursing note reviewed,  Constitutional: well developed, well nourished, no distress HEENT: normocephalic CV: normal rate Pulm/chest wall: normal effort Breast Exam: deferred Abdomen: soft Neuro: alert and oriented x 3 Skin: warm, dry Psych: affect normal Pelvic exam: deferred  Assessment & Plan:  1. Encounter for Depo-Provera  contraception (Primary) UPT neg, discussed options of birth control to include implant, IUD, pills, etc. Discussed potential side effects and potential irregular bleeding for 3-6 months. Desires depo today.  Annual next month   - medroxyPROGESTERone  Acetate 150 MG/ML SUSY; Inject 1 mL (150 mg total) into the muscle every 3 (three) months. Bring with you to injection appointment  Dispense: 1 mL; Refill: 3   Future Appointments  Date Time Provider Department Center  09/08/2023 11:30 AM Javan Messing, NP [facility]     Susi Eric, FNP 4:24 PM

## 2023-09-08 ENCOUNTER — Ambulatory Visit: Admitting: Adult Health

## 2023-10-27 ENCOUNTER — Ambulatory Visit

## 2023-11-03 ENCOUNTER — Ambulatory Visit

## 2023-11-10 ENCOUNTER — Encounter: Payer: Self-pay | Admitting: Adult Health

## 2023-11-10 ENCOUNTER — Other Ambulatory Visit (HOSPITAL_COMMUNITY)
Admission: RE | Admit: 2023-11-10 | Discharge: 2023-11-10 | Disposition: A | Discharge status: Home / Self Care | Source: Ambulatory Visit | Attending: Adult Health | Admitting: Adult Health

## 2023-11-10 ENCOUNTER — Ambulatory Visit: Admitting: Adult Health

## 2023-11-10 VITALS — BP 118/84 | HR 81 | Ht 67.0 in | Wt 142.5 lb

## 2023-11-10 DIAGNOSIS — Z8669 Personal history of other diseases of the nervous system and sense organs: Secondary | ICD-10-CM | POA: Diagnosis not present

## 2023-11-10 DIAGNOSIS — Z0000 Encounter for general adult medical examination without abnormal findings: Secondary | ICD-10-CM

## 2023-11-10 DIAGNOSIS — R87810 Cervical high risk human papillomavirus (HPV) DNA test positive: Secondary | ICD-10-CM | POA: Insufficient documentation

## 2023-11-10 DIAGNOSIS — Z01419 Encounter for gynecological examination (general) (routine) without abnormal findings: Secondary | ICD-10-CM | POA: Diagnosis not present

## 2023-11-10 DIAGNOSIS — Z1151 Encounter for screening for human papillomavirus (HPV): Secondary | ICD-10-CM | POA: Insufficient documentation

## 2023-11-10 NOTE — Progress Notes (Signed)
 Patient ID: Alyssa Hodge, female   DOB: [birth year], 43 y.o.   MRN: [id number] History of Present Illness: Alyssa Hodge is a 43 year old female,married, [id number], in for a well woman gyn exam and pap. Last pap was 08/19/22 + HR HPV and NILM. She had IUD removed in May and got depo 08/04/23 and now wants to get ParaGard IUD due to her migraines. She had sex last night.  PCP is GORMAN Pereyra NP   Current Medications, Allergies, Past Medical History, Past Surgical History, Family History and Social History were reviewed in [employer] record.     Review of Systems: Patient denies any hearing loss, fatigue, blurred vision, shortness of breath, chest pain, abdominal pain, problems with bowel movements, urination, or intercourse. No joint pain or mood swings.  Has migraines was worse with last IUD and had with depo the first month   Physical Exam:BP 118/84 (BP Location: Left Arm, Patient Position: Sitting, Cuff Size: Normal)   Pulse 81   Ht 5' 7 (1.702 m)   Wt 142 lb 8 oz (64.6 kg)   BMI 22.32 kg/m   General:  Well developed, well nourished, no acute distress Skin:  Warm and dry Neck:  Midline trachea, normal thyroid, good ROM, no lymphadenopathy Lungs; Clear to auscultation bilaterally Breast:  No dominant palpable mass, retraction, or nipple discharge Cardiovascular: Regular rate and rhythm Abdomen:  Soft, non tender, no hepatosplenomegaly Pelvic:  External genitalia is normal in appearance, no lesions.  The vagina is normal in appearance. Urethra has no lesions or masses. The cervix is bulbous, nabothian cyst at about 2-3 0' clock, pap with HR HPV genotyping performed.  Uterus is felt to be normal size, shape, and contour.  No adnexal masses or tenderness noted.Bladder is non tender, no masses felt. Rectal: Deferred  Extremities/musculoskeletal:  No swelling or varicosities noted, no clubbing or cyanosis Psych:  No mood changes, alert and cooperative,seems happy AA is 0 Fall risk is  low    11/10/2023   10:55 AM 08/09/2020    1:48 PM 06/15/2018   11:17 AM  Depression screen PHQ 2/9  Decreased Interest 0 0 1  Down, Depressed, Hopeless 0 0 0  PHQ - 2 Score 0 0 1  Altered sleeping 0 0 1  Tired, decreased energy 0 1 1  Change in appetite 1 1 3   Feeling bad or failure about yourself  0 0 1  Trouble concentrating 0 0 0  Moving slowly or fidgety/restless 0 0 0  Suicidal thoughts 0 0 0  PHQ-9 Score 1 2 7        11/10/2023   10:55 AM 08/09/2020    1:49 PM  GAD 7 : Generalized Anxiety Score  Nervous, Anxious, on Edge 0 0  Control/stop worrying 0 0  Worry too much - different things 0 0  Trouble relaxing 0 0  Restless 0 0  Easily annoyed or irritable 0 0  Afraid - awful might happen 0 0  Total GAD 7 Score 0 0      Upstream - 11/10/23 1052       Pregnancy Intention Screening   Does the patient want to become pregnant in the next year? No    Does the patient's partner want to become pregnant in the next year? No    Would the patient like to discuss contraceptive options today? Yes      Contraception Wrap Up   Current Method Hormonal Injection    End Method Abstinence  to come in for ParGard in 2 weeks   Contraception Counseling Provided Yes    How was the end contraceptive method provided? N/A         Examination chaperoned by Clarita Salt LPN   Impression and plan: 1. Routine general medical examination at a health care facility (Primary) Pap sent Physical in 1 year No sex and return 11/23/23 for ParaGard IUD insertion with GORMAN Daring FNP She is aware that ParaGard will not control periods  - Cytology - PAP( [place])  2. History of migraine  3. Papanicolaou smear of cervix with positive high risk human papilloma virus (HPV) test Pap sent

## 2023-11-12 ENCOUNTER — Ambulatory Visit: Payer: Self-pay | Admitting: Adult Health

## 2023-11-12 LAB — CYTOLOGY - PAP
Comment: NEGATIVE
Diagnosis: NEGATIVE
High risk HPV: NEGATIVE

## 2023-11-24 ENCOUNTER — Encounter: Payer: Self-pay | Admitting: Women's Health

## 2023-11-24 ENCOUNTER — Ambulatory Visit: Admitting: Women's Health

## 2023-11-24 VITALS — BP 108/75 | HR 82 | Ht 67.0 in | Wt 144.4 lb

## 2023-11-24 DIAGNOSIS — Z30430 Encounter for insertion of intrauterine contraceptive device: Secondary | ICD-10-CM | POA: Diagnosis not present

## 2023-11-24 DIAGNOSIS — Z3202 Encounter for pregnancy test, result negative: Secondary | ICD-10-CM

## 2023-11-24 LAB — POCT URINE PREGNANCY: Preg Test, Ur: NEGATIVE

## 2023-11-24 MED ORDER — PARAGARD INTRAUTERINE COPPER IU IUD
1.0000 | INTRAUTERINE_SYSTEM | Freq: Once | INTRAUTERINE | Status: AC
  Administered 2023-11-24: 1 via INTRAUTERINE

## 2023-11-24 NOTE — Patient Instructions (Signed)
 Nothing in vagina for 3 days (no sex, douching, tampons, etc...) Check your strings once a month to make sure you can feel them, if you are not able to please let us  know If you develop a fever of 100.4 or more in the next few weeks, or if you develop severe abdominal pain, please let us  know Use a backup method of birth control, such as condoms, for 2 weeks   Copper  Intrauterine Device (IUD) Quel est ce mdicament ? Le DIU AU CUIVRE prvient la grossesse. Il agit en utilisant du cuivre pour [employer] spermatozodes d'atteindre l'ovule (fcondation) sans hormones. C'est un contraceptif de longue dure d'action rversible. Ce mdicament peut tre utilis pour d'autres indications ; adressez-vous  votre mdecin ou  votre pharmacien si vous avez des questions. NOM(S) DE MARQUE COURANT(S) : Miudella , ParaGard  T380A Que dois-je dire  mon fournisseur de [employer] de prendre ce mdicament ? Ils ont besoin de savoir si vous prsentez l'une quelconque des affections ou situations suivantes : Frottis vaginal anormal Cancer, y compris la leucmie, le cancer de l'utrus et le cancer du col de l'utrus Maladie de l'utrus qui change sa forme, comme de grosses tumeurs fibrodes Endomtriose Infection gnitale ou pelvienne actuelle ou survenue au cours des 3 derniers mois Plusieurs partenaires sexuels ou partenaire sexuel ayant plusieurs partenaires Antcdents de grossesse ectopique ou tubaire Troubles du systme immunitaire [place] d'un dispositif intra-utrin Maladie inflammatoire pelvienne (MIP) Infection sexuellement transmissible (IST) Toxicomanie Saignement vaginal inexpliqu Raction inhabituelle ou allergique au cuivre, au sulfate de baryum ou au polythylne Raction inhabituelle ou allergique  d'autres mdicaments,  des aliments,  des colorants ou  des conservateurs Grossesse ou projet de grossesse Allaitement Comment dois-je utiliser ce mdicament ? Ce dispositif est plac   l'intrieur de l'utrus par votre quipe de soins. Une fiche [employer] patient sera fournie avec chaque dispositif. Alyssa Hodge  lire attentivement [employer]. Cette fiche peut tre soumise  des modifications frquentes. Pour toute information relative  l'utilisation de ce mdicament [employer], adressez-vous  votre quipe de [place]. Des prcautions particulires Geophysical data processor. Surdosage : si vous pensez avoir pris ce mdicament en excs, contactez immdiatement un centre [employer].REMARQUE : ce mdicament vous est uniquement destin. Ne partagez pas ce Arts administrator. Que faire si je saute une dose ? Non applicable. Le dispositif devra tre remplac tous les 10 ans si vous souhaitez continuer  utiliser ce type de contraception. Quelles sont les interactions possibles avec ce mdicament ? Aucune interaction n'est attendue. Cette liste peut ne pas dcrire toutes les interactions mdicamenteuses possibles. Donnez  votre fournisseur de [employer] de tous les mdicaments, plantes mdicinales, mdicaments en vente libre ou complments alimentaires que vous prenez. Dites-lui aussi si vous fumez, buvez de l'alcool ou consommez des drogues illicites. Certaines substances peuvent interagir avec votre mdicament. Que dois-je IT consultant par ce mdicament ? Consultez rgulirement votre quipe de soins pour un bilan. Si vous souhaitez avoir un enfant ou si vous pensez que vous tes enceinte, dites-le  votre quipe de [place]. Le DIU devra tre retir. Le DIU ne vous protge pas, ni votre partenaire, contre le VIH ou toute autre infection sexuellement transmissible. Informez votre quipe de soins si vous ou votre partenaire contractez le VIH ou une infection transmissible sexuellement. Vous pouvez Estate agent DIU vous-mme en introduisant vos doigts propres dans votre vagin pour  sentir les fils. Ne tirez [employer].  Prenez l'habitude de Biochemist, clinical du DIU aprs chaque menstruation. Contactez votre quipe de soins immdiatement si vous ne sentez pas uniquement les fils, mais galement d'autres parties du DIU ou si vous ne sentez pas les fils du tout. Le DIU peut se dloger spontanment. Vous pouvez tomber enceinte si le dispositif est dlog. Si vous remarquez que le DIU s'est dlog, utilisez une mthode contraceptive de secours, comme des prservatifs, et appelez votre quipe de [place]. L'utilisation de tampons ne changera pas la position du DIU. Vous pouvez utiliser des tampons pendant vos rgles. Ce DIU peut tre soumis en toute scurit  l'imagerie par rsonance magntique (IRM) uniquement dans certaines conditions. Avant un examen IRM, indiquez  votre quipe de soins que vous tes porteuse d'un DIU et de quel type de DIU il s'agit. Quels effets secondaires puis-je remarquer en prenant ce mdicament ? Effets secondaires que vous devez signaler  votre quipe de soins ds que possible : Ractions allergiques : ruption cutane, dmangeaisons, urticaire, gonflement du visage, des lvres, de la langue ou de la gorge Saignement vaginal abondant Faible numration des globules rouges : faiblesse ou fatigue inhabituelle, vertiges, mal de tte, difficults  respirer Maladie inflammatoire pelvienne (MIP) : fivre, douleur abdominale, douleur pelvienne, douleurs ou difficults pour uriner, saignements intermenstruels, saignement pendant ou aprs les rapports sexuels Crises d'pilepsie (convulsions) Battements cardiaques lents : vertiges, sensation d'vanouissement ou d'tourdissement, confusion, difficults  respirer, faiblesse ou fatigue inhabituelle Douleurs  l'estomac, faiblesse ou fatigue inhabituelle, nauses, vomissements, diarrhe ou fivre durant plus longtemps que prvu Pertes, dmangeaisons ou odeur vaginales inhabituelles Douleur, irritation ou  plaies vaginales Effets secondaires ne ncessitant gnralement pas un avis mdical (signalez-les  votre quipe de soins s'ils persistent ou sont gnants) : Douleurs au dos Cycles menstruels irrguliers ou saignements intermenstruels Crampes menstruelles Pertes vaginales Cette liste peut ne pas dcrire tous les effets secondaires possibles. Appelez votre mdecin pour lui demander conseil au sujet des [employer]. Vous pouvez signaler les effets secondaires  la FDA, au [phone]. O dois-je conserver mon mdicament ? Non applicable. REMARQUE : cette notice est un rsum. Elle peut ne pas contenir toutes les informations possibles. Si vous avez des questions  propos de ce mdicament, Patent attorney mdecin, votre pharmacien ou votre fournisseur de [place].  2025 Elsevier/Gold Standard (2021-07-17 00:00:00)

## 2023-11-24 NOTE — Addendum Note (Signed)
 Addended by: SANNA GONG A on: 11/24/2023 04:21 PM   Modules accepted: Orders

## 2023-11-24 NOTE — Progress Notes (Signed)
 IUD INSERTION Patient name: Alyssa Hodge MRN [id number]  Date of birth: 25-Feb-1981 Subjective Findings:   Alyssa Hodge is a 43 y.o. [id number] female being seen today for insertion of a Paragard  IUD.  No LMP recorded. Patient has had an injection. Last sexual intercourse was 2wks ago Last pap 11/10/23. Results were: NILM w/ HRHPV negative  The risks and benefits of the method and placement have been thouroughly reviewed with the patient and all questions were answered.  Specifically the patient is aware of failure rate of 03/998, expulsion of the IUD and of possible perforation.  The patient is aware of irregular bleeding due to the method and understands the incidence of irregular bleeding diminishes with time.  Signed copy of informed consent in chart.      11/10/2023   10:55 AM 08/09/2020    1:48 PM 06/15/2018   11:17 AM 05/11/2018    1:46 PM  Depression screen PHQ 2/9  Decreased Interest 0 0 1 0  Down, Depressed, Hopeless 0 0 0 0  PHQ - 2 Score 0 0 1 0  Altered sleeping 0 0 1   Tired, decreased energy 0 1 1   Change in appetite 1 1 3    Feeling bad or failure about yourself  0 0 1   Trouble concentrating 0 0 0   Moving slowly or fidgety/restless 0 0 0   Suicidal thoughts 0 0 0   PHQ-9 Score 1 2 7          11/10/2023   10:55 AM 08/09/2020    1:49 PM  GAD 7 : Generalized Anxiety Score  Nervous, Anxious, on Edge 0 0  Control/stop worrying 0 0  Worry too much - different things 0 0  Trouble relaxing 0 0  Restless 0 0  Easily annoyed or irritable 0 0  Afraid - awful might happen 0 0  Total GAD 7 Score 0 0     Pertinent History Reviewed:   Reviewed past medical,surgical, social, obstetrical and family history.  Reviewed problem list, medications and allergies. Objective Findings & Procedure:   Vitals:   11/24/23 1433  BP: 108/75  Pulse: 82  Weight: 144 lb 6.4 oz (65.5 kg)  Height: 5' 7 (1.702 m)  Body mass index is 22.62 kg/m.  Results for orders placed or performed in visit  on 11/24/23 (from the past 24 hours)  POCT urine pregnancy   Collection Time: 11/24/23  2:52 PM  Result Value Ref Range   Preg Test, Ur Negative Negative     Time out was performed.  A graves speculum was placed in the vagina.  The cervix was visualized, prepped using Betadine, and grasped with a single tooth tenaculum. The uterus was found to be anteroflexed and it sounded to 8 cm.  Paragard   IUD placed per manufacturer's recommendations. The strings were trimmed to approximately 3 cm. The patient tolerated the procedure well.   Informal transvaginal sonogram was performed and the proper placement of the IUD was verified by me & Hospital doctor, ultrasonographer  Chaperone: Peggy Dones Assessment & Plan:   1) Paragard  IUD insertion The patient was given post procedure instructions, including signs and symptoms of infection and to check for the strings after each menses or each month, and refraining from intercourse or anything in the vagina for 3 days. She was given a care card with date IUD placed, and date IUD to be removed. She is scheduled for a f/u appointment in 4 weeks.  Orders Placed This Encounter  Procedures   POCT urine pregnancy    Return in about 4 weeks (around 12/22/2023) for IUD f/u, CNM, in person.  Suzen JONELLE Fetters CNM, [facility] 11/24/2023 3:13 PM

## 2023-12-22 ENCOUNTER — Ambulatory Visit: Admitting: Women's Health

## 2023-12-29 ENCOUNTER — Ambulatory Visit: Admitting: Women's Health

## 2023-12-29 ENCOUNTER — Encounter: Payer: Self-pay | Admitting: Women's Health

## 2023-12-29 VITALS — BP 118/81 | HR 81 | Ht 67.0 in | Wt 147.0 lb

## 2023-12-29 DIAGNOSIS — Z30431 Encounter for routine checking of intrauterine contraceptive device: Secondary | ICD-10-CM

## 2023-12-29 NOTE — Progress Notes (Signed)
   GYN VISIT Patient name: Alyssa Hodge MRN [id number]  Date of birth: [birth year] Chief Complaint:   Follow-up (IUD check)  History of Present Illness:   Alyssa Hodge is a 43 y.o. [id number] female being seen today for IUD check. Had Paragard  inserted 11/24/23.  Bled from 10/3 until yesterday, not heavy. No other problems. Denies abnormal discharge, itching/odor/irritation.   Patient's last menstrual period was 12/04/2023. The current method of family planning is IUD.  Last pap 11/10/23. Results were: NILM w/ HRHPV negative     11/10/2023   10:55 AM 08/09/2020    1:48 PM 06/15/2018   11:17 AM 05/11/2018    1:46 PM  Depression screen PHQ 2/9  Decreased Interest 0 0 1 0  Down, Depressed, Hopeless 0 0 0 0  PHQ - 2 Score 0 0 1 0  Altered sleeping 0 0 1   Tired, decreased energy 0 1 1   Change in appetite 1 1 3    Feeling bad or failure about yourself  0 0 1   Trouble concentrating 0 0 0   Moving slowly or fidgety/restless 0 0 0   Suicidal thoughts 0 0 0   PHQ-9 Score 1 2 7          11/10/2023   10:55 AM 08/09/2020    1:49 PM  GAD 7 : Generalized Anxiety Score  Nervous, Anxious, on Edge 0 0  Control/stop worrying 0 0  Worry too much - different things 0 0  Trouble relaxing 0 0  Restless 0 0  Easily annoyed or irritable 0 0  Afraid - awful might happen 0 0  Total GAD 7 Score 0 0     Review of Systems:   Pertinent items are noted in HPI Denies fever/chills, dizziness, headaches, visual disturbances, fatigue, shortness of breath, chest pain, abdominal pain, vomiting, abnormal vaginal discharge/itching/odor/irritation, problems with periods, bowel movements, urination, or intercourse unless otherwise stated above.  Pertinent History Reviewed:  Reviewed past medical,surgical, social, obstetrical and family history.  Reviewed problem list, medications and allergies. Physical Assessment:   Vitals:   12/29/23 1119  BP: 118/81  Pulse: 81  Weight: 147 lb (66.7 kg)  Height: 5' 7 (1.702 m)   Body mass index is 23.02 kg/m.       Physical Examination:   General appearance: alert, well appearing, and in no distress  Mental status: alert, oriented to person, place, and time  Skin: warm & dry   Cardiovascular: normal heart rate noted  Respiratory: normal respiratory effort, no distress  Abdomen: soft, non-tender   Pelvic: VULVA: normal appearing vulva with no masses, tenderness or lesions, VAGINA: normal appearing vagina with normal color and discharge, no lesions, CERVIX: normal appearing cervix without discharge or lesions, IUD strings visible and appropriate length  Extremities: no edema   Chaperone: Peggy Dones  No results found for this or any previous visit (from the past 24 hours).  Assessment & Plan:  1) IUD check> in place, bled for 3wks (not heavy), has now stopped. Discussed giving it a couple of months, if happens every month, let us  know. Could try megace or try something else if she would like  Meds: No orders of the defined types were placed in this encounter.   No orders of the defined types were placed in this encounter.   Return in about 1 year (around 12/28/2024) for Pap & physical.  Suzen JONELLE Fetters CNM, [facility] 12/29/2023 11:48 AM
